# Patient Record
Sex: Female | Born: 1960 | Race: Black or African American | Hispanic: No | Smoking: Never smoker
Health system: Southern US, Community
[De-identification: ages and names within clinical notes are randomized; demographics above are authoritative.]

## PROBLEM LIST (undated history)

## (undated) DIAGNOSIS — C50919 Malignant neoplasm of unspecified site of unspecified female breast: Secondary | ICD-10-CM

## (undated) DIAGNOSIS — E119 Type 2 diabetes mellitus without complications: Secondary | ICD-10-CM

## (undated) DIAGNOSIS — I10 Essential (primary) hypertension: Secondary | ICD-10-CM

## (undated) DIAGNOSIS — C801 Malignant (primary) neoplasm, unspecified: Secondary | ICD-10-CM

## (undated) HISTORY — PX: ABDOMINAL HYSTERECTOMY: SHX81

## (undated) HISTORY — PX: BREAST LUMPECTOMY: SHX2

## (undated) HISTORY — PX: BACK SURGERY: SHX140

## (undated) HISTORY — PX: BREAST SURGERY: SHX581

## (undated) HISTORY — PX: CHOLECYSTECTOMY: SHX55

---

## 2016-09-19 ENCOUNTER — Encounter: Payer: Self-pay | Admitting: Emergency Medicine

## 2016-09-19 ENCOUNTER — Observation Stay
Admission: EM | Admit: 2016-09-19 | Discharge: 2016-09-20 | Disposition: A | Discharge status: Home / Self Care | Attending: Internal Medicine | Admitting: Internal Medicine

## 2016-09-19 ENCOUNTER — Emergency Department

## 2016-09-19 DIAGNOSIS — Z7984 Long term (current) use of oral hypoglycemic drugs: Secondary | ICD-10-CM | POA: Diagnosis not present

## 2016-09-19 DIAGNOSIS — E119 Type 2 diabetes mellitus without complications: Secondary | ICD-10-CM | POA: Diagnosis not present

## 2016-09-19 DIAGNOSIS — R112 Nausea with vomiting, unspecified: Secondary | ICD-10-CM | POA: Diagnosis present

## 2016-09-19 DIAGNOSIS — M797 Fibromyalgia: Secondary | ICD-10-CM | POA: Insufficient documentation

## 2016-09-19 DIAGNOSIS — R42 Dizziness and giddiness: Secondary | ICD-10-CM | POA: Diagnosis not present

## 2016-09-19 DIAGNOSIS — Z853 Personal history of malignant neoplasm of breast: Secondary | ICD-10-CM | POA: Diagnosis not present

## 2016-09-19 DIAGNOSIS — K529 Noninfective gastroenteritis and colitis, unspecified: Secondary | ICD-10-CM | POA: Diagnosis not present

## 2016-09-19 DIAGNOSIS — E876 Hypokalemia: Secondary | ICD-10-CM | POA: Diagnosis not present

## 2016-09-19 DIAGNOSIS — I10 Essential (primary) hypertension: Secondary | ICD-10-CM | POA: Insufficient documentation

## 2016-09-19 DIAGNOSIS — R197 Diarrhea, unspecified: Secondary | ICD-10-CM

## 2016-09-19 HISTORY — DX: Malignant (primary) neoplasm, unspecified: C80.1

## 2016-09-19 HISTORY — DX: Essential (primary) hypertension: I10

## 2016-09-19 HISTORY — DX: Type 2 diabetes mellitus without complications: E11.9

## 2016-09-19 LAB — URINALYSIS, COMPLETE (UACMP) WITH MICROSCOPIC
BILIRUBIN URINE: NEGATIVE
Glucose, UA: NEGATIVE mg/dL
HGB URINE DIPSTICK: NEGATIVE
KETONES UR: NEGATIVE mg/dL
LEUKOCYTES UA: NEGATIVE
NITRITE: POSITIVE — AB
PH: 6 (ref 5.0–8.0)
Protein, ur: NEGATIVE mg/dL
SPECIFIC GRAVITY, URINE: 1.012 (ref 1.005–1.030)

## 2016-09-19 LAB — COMPREHENSIVE METABOLIC PANEL
ALT: 27 U/L (ref 14–54)
AST: 35 U/L (ref 15–41)
Albumin: 4.3 g/dL (ref 3.5–5.0)
Alkaline Phosphatase: 68 U/L (ref 38–126)
Anion gap: 9 (ref 5–15)
BILIRUBIN TOTAL: 0.8 mg/dL (ref 0.3–1.2)
BUN: 15 mg/dL (ref 6–20)
CHLORIDE: 100 mmol/L — AB (ref 101–111)
CO2: 29 mmol/L (ref 22–32)
Calcium: 9.5 mg/dL (ref 8.9–10.3)
Creatinine, Ser: 0.64 mg/dL (ref 0.44–1.00)
Glucose, Bld: 171 mg/dL — ABNORMAL HIGH (ref 65–99)
POTASSIUM: 3 mmol/L — AB (ref 3.5–5.1)
Sodium: 138 mmol/L (ref 135–145)
TOTAL PROTEIN: 9.3 g/dL — AB (ref 6.5–8.1)

## 2016-09-19 LAB — CBC
HEMATOCRIT: 37.1 % (ref 35.0–47.0)
Hemoglobin: 12.2 g/dL (ref 12.0–16.0)
MCH: 25.5 pg — ABNORMAL LOW (ref 26.0–34.0)
MCHC: 32.9 g/dL (ref 32.0–36.0)
MCV: 77.3 fL — AB (ref 80.0–100.0)
Platelets: 238 10*3/uL (ref 150–440)
RBC: 4.8 MIL/uL (ref 3.80–5.20)
RDW: 17.7 % — AB (ref 11.5–14.5)
WBC: 8.7 10*3/uL (ref 3.6–11.0)

## 2016-09-19 LAB — GASTROINTESTINAL PANEL BY PCR, STOOL (REPLACES STOOL CULTURE)
Adenovirus F40/41: NOT DETECTED
Astrovirus: NOT DETECTED
CRYPTOSPORIDIUM: NOT DETECTED
CYCLOSPORA CAYETANENSIS: NOT DETECTED
Campylobacter species: NOT DETECTED
ENTEROAGGREGATIVE E COLI (EAEC): NOT DETECTED
Entamoeba histolytica: NOT DETECTED
Enteropathogenic E coli (EPEC): NOT DETECTED
Enterotoxigenic E coli (ETEC): NOT DETECTED
GIARDIA LAMBLIA: NOT DETECTED
Norovirus GI/GII: NOT DETECTED
Plesimonas shigelloides: NOT DETECTED
Rotavirus A: NOT DETECTED
SALMONELLA SPECIES: NOT DETECTED
SAPOVIRUS (I, II, IV, AND V): NOT DETECTED
SHIGA LIKE TOXIN PRODUCING E COLI (STEC): NOT DETECTED
SHIGELLA/ENTEROINVASIVE E COLI (EIEC): NOT DETECTED
VIBRIO CHOLERAE: NOT DETECTED
VIBRIO SPECIES: NOT DETECTED
YERSINIA ENTEROCOLITICA: NOT DETECTED

## 2016-09-19 LAB — LIPASE, BLOOD: LIPASE: 16 U/L (ref 11–51)

## 2016-09-19 LAB — TROPONIN I: Troponin I: <0.03 ng/mL (ref <0.03)

## 2016-09-19 LAB — INFLUENZA PANEL BY PCR (TYPE A & B)
Influenza A By PCR: NEGATIVE
Influenza B By PCR: NEGATIVE

## 2016-09-19 MED ORDER — DEXTROSE 5 % IV SOLN: 1.0000 g | Freq: Once | INTRAVENOUS | Status: DC

## 2016-09-19 MED ORDER — SODIUM CHLORIDE 0.9 % IV BOLUS (SEPSIS)
500.0000 mL | Freq: Once | INTRAVENOUS | Status: AC
  Administered 2016-09-19: 500 mL via INTRAVENOUS

## 2016-09-19 MED ORDER — ONDANSETRON 4 MG PO TBDP
ORAL_TABLET | ORAL | Status: AC
  Filled 2016-09-19: qty 1

## 2016-09-19 MED ORDER — ONDANSETRON 4 MG PO TBDP
ORAL_TABLET | ORAL | Status: AC
  Administered 2016-09-19: 4 mg via ORAL
  Filled 2016-09-19: qty 1

## 2016-09-19 MED ORDER — POTASSIUM CHLORIDE CRYS ER 20 MEQ PO TBCR
40.0000 meq | EXTENDED_RELEASE_TABLET | Freq: Once | ORAL | Status: AC
  Administered 2016-09-19: 40 meq via ORAL

## 2016-09-19 MED ORDER — ONDANSETRON 4 MG PO TBDP
4.0000 mg | ORAL_TABLET | Freq: Once | ORAL | Status: AC | PRN
  Administered 2016-09-19: 4 mg via ORAL

## 2016-09-19 MED ORDER — IOPAMIDOL (ISOVUE-300) INJECTION 61%
100.0000 mL | Freq: Once | INTRAVENOUS | Status: AC | PRN
  Administered 2016-09-19: 100 mL via INTRAVENOUS
  Filled 2016-09-19: qty 100

## 2016-09-19 MED ORDER — POTASSIUM CHLORIDE CRYS ER 20 MEQ PO TBCR
EXTENDED_RELEASE_TABLET | ORAL | Status: AC
  Administered 2016-09-19: 40 meq via ORAL
  Filled 2016-09-19: qty 2

## 2016-09-19 MED ORDER — PROMETHAZINE HCL 25 MG/ML IJ SOLN
INTRAMUSCULAR | Status: AC
  Administered 2016-09-19: 12.5 mg via INTRAVENOUS
  Filled 2016-09-19: qty 1

## 2016-09-19 MED ORDER — PROMETHAZINE HCL 25 MG/ML IJ SOLN
12.5000 mg | Freq: Once | INTRAMUSCULAR | Status: AC
  Administered 2016-09-19: 12.5 mg via INTRAVENOUS

## 2016-09-19 MED ORDER — SODIUM CHLORIDE 0.9 % IV SOLN
30.0000 meq | Freq: Once | INTRAVENOUS | Status: AC
  Administered 2016-09-19: 30 meq via INTRAVENOUS
  Filled 2016-09-19: qty 15

## 2016-09-19 MED ORDER — MECLIZINE HCL 25 MG PO TABS
25.0000 mg | ORAL_TABLET | Freq: Once | ORAL | Status: AC
  Administered 2016-09-19: 25 mg via ORAL
  Filled 2016-09-19: qty 1

## 2016-09-19 MED ORDER — SODIUM CHLORIDE 0.9 % IV BOLUS (SEPSIS)
1000.0000 mL | Freq: Once | INTRAVENOUS | Status: AC
  Administered 2016-09-19: 1000 mL via INTRAVENOUS

## 2016-09-19 MED ORDER — METOCLOPRAMIDE HCL 5 MG/ML IJ SOLN
10.0000 mg | Freq: Once | INTRAMUSCULAR | Status: AC
  Administered 2016-09-19: 10 mg via INTRAVENOUS
  Filled 2016-09-19: qty 2

## 2016-09-19 NOTE — ED Notes (Signed)
Called pharmacy to request medication 

## 2016-09-19 NOTE — ED Provider Notes (Addendum)
[facility] Emergency Department Provider Note    First MD Initiated Contact with Patient 09/19/16 1651     (approximate)  I have reviewed the triage vital signs and the nursing notes.   HISTORY  Chief Complaint Emesis and Diarrhea    HPI Marie Cline is a 56 y.o. female with a history of diabetes, hypertension, breast cancer as well as fibromyalgia presents with sudden onset of nausea vomiting and diarrhea that occurred this afternoon. No significant abdominal pain. States that she vomited 1 times upon arrival. States that since the vomiting started she had been feeling very dizzy anytime she opens her eyes or tries to move. Denies any associated numbness or tingling. Denies any dysuria or flank pain.  States that every time she stands up she feels like she is about to faint due to the nausea and dizziness. Was given Zofran in triage with some improvement in the nausea.   Past Medical History:  Diagnosis Date  . Cancer [facility])    Breast Cancer  . Diabetes mellitus without complication ([place])   . Hypertension    No family history on file. Past Surgical History:  Procedure Laterality Date  . ABDOMINAL HYSTERECTOMY    . BACK SURGERY    . BREAST SURGERY Left   . CHOLECYSTECTOMY     There are no active problems to display for this patient.     Prior to Admission medications   Not on File    Allergies Iodine solution [povidone iodine]    Social History Social History  Substance Use Topics  . Smoking status: Never Smoker  . Smokeless tobacco: Never Used  . Alcohol use No    Review of Systems Patient denies headaches, rhinorrhea, blurry vision, numbness, shortness of breath, chest pain, edema, cough, abdominal pain, nausea, vomiting, diarrhea, dysuria, fevers, rashes or hallucinations unless otherwise stated above in HPI. ____________________________________________   PHYSICAL EXAM:  VITAL SIGNS: Vitals:   09/19/16 1514 09/19/16  2025  BP: (!) 167/101 (!) 164/93  Pulse: 85 67  Resp: 18 19  Temp: 98 F (36.7 C)     Constitutional: Alert and oriented. Ill appearing but in no acute distress. Eyes: Conjunctivae are normal. PERRL. EOMI. Head: Atraumatic. Nose: No congestion/rhinnorhea. Mouth/Throat: Mucous membranes are moist.  Oropharynx non-erythematous. Neck: No stridor. Painless ROM. No cervical spine tenderness to palpation Hematological/Lymphatic/Immunilogical: No cervical lymphadenopathy. Cardiovascular: Normal rate, regular rhythm. Grossly normal heart sounds.  Good peripheral circulation. Respiratory: Normal respiratory effort.  No retractions. Lungs CTAB. Gastrointestinal: Soft and with mild llq ttp. No distention. No abdominal bruits. No CVA tenderness. Musculoskeletal: No lower extremity tenderness nor edema.  No joint effusions. Neurologic:  Normal speech and language. Lateral nystagmus with leftward gaze that is fatiguable.  No facial droop.  SILT throughout. MAE.  Able to ambulate Skin:  Skin is warm, dry and intact. No rash noted. Psychiatric: Mood and affect are normal. Speech and behavior are normal.  ____________________________________________   LABS (all labs ordered are listed, but only abnormal results are displayed)  Results for orders placed or performed during the hospital encounter of 09/19/16 (from the past 24 hour(s))  Lipase, blood     Status: None   Collection Time: 09/19/16  3:16 PM  Result Value Ref Range   Lipase 16 11 - 51 U/L  Comprehensive metabolic panel     Status: Abnormal   Collection Time: 09/19/16  3:16 PM  Result Value Ref Range   Sodium 138 135 - 145 mmol/L  Potassium 3.0 (L) 3.5 - 5.1 mmol/L   Chloride 100 (L) 101 - 111 mmol/L   CO2 29 22 - 32 mmol/L   Glucose, Bld 171 (H) 65 - 99 mg/dL   BUN 15 6 - 20 mg/dL   Creatinine, Ser 0.64 0.44 - 1.00 mg/dL   Calcium 9.5 8.9 - 10.3 mg/dL   Total Protein 9.3 (H) 6.5 - 8.1 g/dL   Albumin 4.3 3.5 - 5.0 g/dL   AST 35  15 - 41 U/L   ALT 27 14 - 54 U/L   Alkaline Phosphatase 68 38 - 126 U/L   Total Bilirubin 0.8 0.3 - 1.2 mg/dL   GFR calc non Af Amer >60 >60 mL/min   GFR calc Af Amer >60 >60 mL/min   Anion gap 9 5 - 15  CBC     Status: Abnormal   Collection Time: 09/19/16  3:16 PM  Result Value Ref Range   WBC 8.7 3.6 - 11.0 K/uL   RBC 4.80 3.80 - 5.20 MIL/uL   Hemoglobin 12.2 12.0 - 16.0 g/dL   HCT 37.1 35.0 - 47.0 %   MCV 77.3 (L) 80.0 - 100.0 fL   MCH 25.5 (L) 26.0 - 34.0 pg   MCHC 32.9 32.0 - 36.0 g/dL   RDW 17.7 (H) 11.5 - 14.5 %   Platelets 238 150 - 440 K/uL  Urinalysis, Complete w Microscopic     Status: Abnormal   Collection Time: 09/19/16  3:16 PM  Result Value Ref Range   Color, Urine YELLOW (A) YELLOW   APPearance HAZY (A) CLEAR   Specific Gravity, Urine 1.012 1.005 - 1.030   pH 6.0 5.0 - 8.0   Glucose, UA NEGATIVE NEGATIVE mg/dL   Hgb urine dipstick NEGATIVE NEGATIVE   Bilirubin Urine NEGATIVE NEGATIVE   Ketones, ur NEGATIVE NEGATIVE mg/dL   Protein, ur NEGATIVE NEGATIVE mg/dL   Nitrite POSITIVE (A) NEGATIVE   Leukocytes, UA NEGATIVE NEGATIVE   RBC / HPF 0-5 0 - 5 RBC/hpf   WBC, UA 6-30 0 - 5 WBC/hpf   Bacteria, UA MANY (A) NONE SEEN   Squamous Epithelial / LPF 0-5 (A) NONE SEEN   Mucous PRESENT   Troponin I     Status: None   Collection Time: 09/19/16  3:16 PM  Result Value Ref Range   Troponin I <0.03 <0.03 ng/mL  Influenza panel by PCR (type A & B)     Status: None   Collection Time: 09/19/16  7:02 PM  Result Value Ref Range   Influenza A By PCR NEGATIVE NEGATIVE   Influenza B By PCR NEGATIVE NEGATIVE  Gastrointestinal Panel by PCR , Stool     Status: None   Collection Time: 09/19/16  8:49 PM  Result Value Ref Range   Campylobacter species NOT DETECTED NOT DETECTED   Plesimonas shigelloides NOT DETECTED NOT DETECTED   Salmonella species NOT DETECTED NOT DETECTED   Yersinia enterocolitica NOT DETECTED NOT DETECTED   Vibrio species NOT DETECTED NOT DETECTED    Vibrio cholerae NOT DETECTED NOT DETECTED   Enteroaggregative E coli (EAEC) NOT DETECTED NOT DETECTED   Enteropathogenic E coli (EPEC) NOT DETECTED NOT DETECTED   Enterotoxigenic E coli (ETEC) NOT DETECTED NOT DETECTED   Shiga like toxin producing E coli (STEC) NOT DETECTED NOT DETECTED   Shigella/Enteroinvasive E coli (EIEC) NOT DETECTED NOT DETECTED   Cryptosporidium NOT DETECTED NOT DETECTED   Cyclospora cayetanensis NOT DETECTED NOT DETECTED   Entamoeba histolytica NOT DETECTED NOT DETECTED  Giardia lamblia NOT DETECTED NOT DETECTED   Adenovirus F40/41 NOT DETECTED NOT DETECTED   Astrovirus NOT DETECTED NOT DETECTED   Norovirus GI/GII NOT DETECTED NOT DETECTED   Rotavirus A NOT DETECTED NOT DETECTED   Sapovirus (I, II, IV, and V) NOT DETECTED NOT DETECTED   ____________________________________________  EKG My review and personal interpretation at Time: 19:04   Indication: dizziness  Rate: 70  Rhythm: sinus Axis: normal Other: no st elevations or depressions, normal intervals ____________________________________________  RADIOLOGY  I personally reviewed all radiographic images ordered to evaluate for the above acute complaints and reviewed radiology reports and findings.  These findings were personally discussed with the patient.  Please see medical record for radiology report.  ____________________________________________   PROCEDURES  Procedure(s) performed:  Procedures    Critical Care performed: no ____________________________________________   INITIAL IMPRESSION / ASSESSMENT AND PLAN / ED COURSE  Pertinent labs & imaging results that were available during my care of the patient were reviewed by me and considered in my medical decision making (see chart for details).  DDX: enteritis, flu, colitis, diverticultisis, sbo, cva, dehydration, dysrhythmia  Marie Cline is a 56 y.o. who presents to the ED with chief complaint of dizziness and lightheaded as well as  nausea vomiting and diarrhea. Patient's afebrile and otherwise hemodynamic stable but very ill-appearing.  Very confusing presentation and difficult to get patient to pin down whether she was primarily dizzy versus dizziness and symptoms resulted after the nausea and vomiting. She is no focal neurodeficits. May have component of vertigo given the abruptness of nausea vomiting and dizziness. However, would not expect this to be associated with diarrhea. Does have some abdominal tenderness to palpation and based on her age will order CT imaging to evaluate for any evidence of obstruction, colitis or diverticulitis.  The patient will be placed on continuous pulse oximetry and telemetry for monitoring.  Laboratory evaluation will be sent to evaluate for the above complaints.     Clinical Course as of Sep 20 2235  Thu Sep 19, 2016  2030 Patient reassessed. States symptoms are improving. Discussed results of CT imaging with patient. Urinalysis was some bacteria and nitrite positive the patient denies any sort of dysuria or urgency. Likely contaminated specimen.  [PR]  2143 Patient unable tolerate by mouth medication.  Had another episode of large volume emesis and diarrhea. We'll give IV potassium abd continue IVF  [PR]  2231 Patient rechecked. Remains human dynamically stable. Again denies any dysuria or flank pain. Stool panel is unremarkable. At this point I do suspect some component of enteritis. Presentation is unclear. She is unable to tolerate by mouth at this point she is receiving IV potassium for supplementation as well as IV fluids. Discussed case with the hospitalist, dr. Ara Kussmaul, for evaluation for observation for continued IV fluids and symptomatic management.  Patient feels symptomatically better and in no acute distress at he feel he should be appropriate for discharge home however she does not want to be discharged at this point.  Have discussed with the patient and available family all  diagnostics and treatments performed thus far and all questions were answered to the best of my ability. The patient demonstrates understanding and agreement with plan.   [PR]    Clinical Course User Index [PR] Merlyn Lot, MD     ____________________________________________   FINAL CLINICAL IMPRESSION(S) / ED DIAGNOSES  Final diagnoses:  Nausea vomiting and diarrhea  Dizziness  Hypokalemia      NEW MEDICATIONS STARTED  DURING THIS VISIT:  New Prescriptions   No medications on file     Note:  This document was prepared using Dragon voice recognition software and may include unintentional dictation errors.    Merlyn Lot, MD 09/19/16 8335    Merlyn Lot, MD 09/19/16 2237

## 2016-09-19 NOTE — H&P (Signed)
History and Physical   [facility] [place] [facility] Admission History and Physical [employer], D.O.    Patient Name: Marie Cline MR#: [id number] Date of Birth: [birth year] Date of Admission: 09/19/2016  Referring MD/NP/PA: Dr. Quentin Cornwall Primary Care Physician: Pcp Not In System Patient coming from: Home Outpatient Specialists: None   Chief Complaint:  Chief Complaint  Patient presents with  . Emesis  . Diarrhea    HPI: Marie Cline is a 56 y.o. female with a known history of Fibromyalgia, Breast cancer, diabetes, hypertension presents to the emergency department for evaluation of nausea, vomiting, diarrhea.  Patient was in a usual state of health until this afternoon when she developed a sudden onset of nausea vomiting and diarrhea without any significant abdominal pain. Her vomiting has been intractable and has been associated with dizziness and lightheadedness area she does have some improvement with Zofran..  Patient denies fevers/chills, weakness, dizziness, chest pain, shortness of breath, abdominal pain, back or flank pain, dysuria/frequency, changes in mental status.    Otherwise there has been no change in status. Patient has been taking medication as prescribed and there has been no recent change in medication or diet.  No recent antibiotics.  There has been no recent illness, hospitalizations, travel or sick contacts.    EMS/ED Course: Patient received meclizine, Reglan, Zofran, potassium, Phenergan, normal saline.  Review of Systems:  CONSTITUTIONAL: No fever/chills, fatigue, weakness, weight gain/loss, headache. EYES: No blurry or double vision. ENT: No tinnitus, postnasal drip, redness or soreness of the oropharynx. RESPIRATORY: No cough, dyspnea, wheeze.  No hemoptysis.  CARDIOVASCULAR: No chest pain, palpitations, syncope, orthopnea. No lower extremity edema.  GASTROINTESTINAL: Positive nausea, vomiting, diarrhea, negative abdominal pain,  constipation.  No hematemesis, melena or hematochezia. GENITOURINARY: No dysuria, frequency, hematuria. ENDOCRINE: No polyuria or nocturia. No heat or cold intolerance. HEMATOLOGY: No anemia, bruising, bleeding. INTEGUMENTARY: No rashes, ulcers, lesions. MUSCULOSKELETAL: No arthritis, gout, dyspnea. NEUROLOGIC: No numbness, tingling, ataxia, seizure-type activity, weakness. PSYCHIATRIC: No anxiety, depression, insomnia.   Past Medical History:  Diagnosis Date  . Cancer [facility])    Breast Cancer  . Diabetes mellitus without complication ([place])   . Hypertension     Past Surgical History:  Procedure Laterality Date  . ABDOMINAL HYSTERECTOMY    . BACK SURGERY    . BREAST SURGERY Left   . CHOLECYSTECTOMY       reports that she has never smoked. She has never used smokeless tobacco. She reports that she does not drink alcohol or use drugs.  Allergies  Allergen Reactions  . Iodine Solution [Povidone Iodine] Itching and Swelling    No family history on file.  Prior to Admission medications   Not on File    Physical Exam: Vitals:   09/19/16 1514 09/19/16 2025 09/19/16 2235  BP: (!) 167/101 (!) 164/93 (!) 150/91  Pulse: 85 67 71  Resp: 18 19 15   Temp: 98 F (36.7 C)    SpO2: 98% 98% 100%  Weight: 102.1 kg (225 lb)    Height: 5\' 7"  (1.702 m)      GENERAL: 56 y.o.-year-oFemale, well-developed, well-nourished lying in the bed in no acute distress.  Pleasant and cooperative.  Ill-appearing HEENT: Head atraumatic, normocephalic. Pupils equal, round, reactive to light and accommodation. No scleral icterus. Extraocular muscles intact. Nares are patent. Oropharynx is clear. Mucus membranes dry. NECK: Supple, full range of motion. No JVD, no bruit heard. No thyroid enlargement, no tenderness, no cervical lymphadenopathy. CHEST: Normal breath sounds bilaterally. No  wheezing, rales, rhonchi or crackles. No use of accessory muscles of respiration.  No reproducible chest wall tenderness.   CARDIOVASCULAR: S1, S2 normal. No murmurs, rubs, or gallops. Cap refill <2 seconds. Pulses intact distally.  ABDOMEN: Soft, nondistended, nontender. No rebound, guarding, rigidity. Normoactive bowel sounds present in all four quadrants. No organomegaly or mass. EXTREMITIES: No pedal edema, cyanosis, or clubbing. No calf tenderness or Homan's sign.  NEUROLOGIC: The patient is alert and oriented x 3. Cranial nerves II through XII are grossly intact with no focal sensorimotor deficit. Muscle strength 5/5 in all extremities. Sensation intact. Gait not checked. PSYCHIATRIC:  Normal affect, mood, thought content. SKIN: Warm, dry, and intact without obvious rash, lesion, or ulcer.    Labs on Admission:  CBC:  Recent Labs Lab 09/19/16 1516  WBC 8.7  HGB 12.2  HCT 37.1  MCV 77.3*  PLT 623   Basic Metabolic Panel:  Recent Labs Lab 09/19/16 1516  NA 138  K 3.0*  CL 100*  CO2 29  GLUCOSE 171*  BUN 15  CREATININE 0.64  CALCIUM 9.5   GFR: Estimated Creatinine Clearance: 97.6 mL/min (by C-G formula based on SCr of 0.64 mg/dL). Liver Function Tests:  Recent Labs Lab 09/19/16 1516  AST 35  ALT 27  ALKPHOS 68  BILITOT 0.8  PROT 9.3*  ALBUMIN 4.3    Recent Labs Lab 09/19/16 1516  LIPASE 16   No results for input(s): AMMONIA in the last 168 hours. Coagulation Profile: No results for input(s): INR, PROTIME in the last 168 hours. Cardiac Enzymes:  Recent Labs Lab 09/19/16 1516  TROPONINI <0.03   BNP (last 3 results) No results for input(s): PROBNP in the last 8760 hours. HbA1C: No results for input(s): HGBA1C in the last 72 hours. CBG: No results for input(s): GLUCAP in the last 168 hours. Lipid Profile: No results for input(s): CHOL, HDL, LDLCALC, TRIG, CHOLHDL, LDLDIRECT in the last 72 hours. Thyroid Function Tests: No results for input(s): TSH, T4TOTAL, FREET4, T3FREE, THYROIDAB in the last 72 hours. Anemia Panel: No results for input(s): VITAMINB12, FOLATE,  FERRITIN, TIBC, IRON, RETICCTPCT in the last 72 hours. Urine analysis:    Component Value Date/Time   COLORURINE YELLOW (A) 09/19/2016 1516   APPEARANCEUR HAZY (A) 09/19/2016 1516   LABSPEC 1.012 09/19/2016 1516   PHURINE 6.0 09/19/2016 1516   GLUCOSEU NEGATIVE 09/19/2016 1516   HGBUR NEGATIVE 09/19/2016 1516   BILIRUBINUR NEGATIVE 09/19/2016 1516   KETONESUR NEGATIVE 09/19/2016 1516   PROTEINUR NEGATIVE 09/19/2016 1516   NITRITE POSITIVE (A) 09/19/2016 1516   LEUKOCYTESUR NEGATIVE 09/19/2016 1516   Sepsis Labs: @LABRCNTIP (procalcitonin:4,lacticidven:4) ) Recent Results (from the past 240 hour(s))  Gastrointestinal Panel by PCR , Stool     Status: None   Collection Time: 09/19/16  8:49 PM  Result Value Ref Range Status   Campylobacter species NOT DETECTED NOT DETECTED Final   Plesimonas shigelloides NOT DETECTED NOT DETECTED Final   Salmonella species NOT DETECTED NOT DETECTED Final   Yersinia enterocolitica NOT DETECTED NOT DETECTED Final   Vibrio species NOT DETECTED NOT DETECTED Final   Vibrio cholerae NOT DETECTED NOT DETECTED Final   Enteroaggregative E coli (EAEC) NOT DETECTED NOT DETECTED Final   Enteropathogenic E coli (EPEC) NOT DETECTED NOT DETECTED Final   Enterotoxigenic E coli (ETEC) NOT DETECTED NOT DETECTED Final   Shiga like toxin producing E coli (STEC) NOT DETECTED NOT DETECTED Final   Shigella/Enteroinvasive E coli (EIEC) NOT DETECTED NOT DETECTED Final   Cryptosporidium NOT  DETECTED NOT DETECTED Final   Cyclospora cayetanensis NOT DETECTED NOT DETECTED Final   Entamoeba histolytica NOT DETECTED NOT DETECTED Final   Giardia lamblia NOT DETECTED NOT DETECTED Final   Adenovirus F40/41 NOT DETECTED NOT DETECTED Final   Astrovirus NOT DETECTED NOT DETECTED Final   Norovirus GI/GII NOT DETECTED NOT DETECTED Final   Rotavirus A NOT DETECTED NOT DETECTED Final   Sapovirus (I, II, IV, and V) NOT DETECTED NOT DETECTED Final     Radiological Exams on  Admission: Ct Head Wo Contrast  Result Date: 09/19/2016 CLINICAL DATA:  Sudden onset of nausea and vomiting.  Dizziness. EXAM: CT HEAD WITHOUT CONTRAST TECHNIQUE: Contiguous axial images were obtained from the base of the skull through the vertex without intravenous contrast. COMPARISON:  None. FINDINGS: Brain: No evidence of acute infarction, hemorrhage, hydrocephalus, extra-axial collection or mass lesion/mass effect. Normal cerebral volume. No white matter disease. Vascular: Mild vascular calcification in the carotid siphons. Skull: Calvarium is intact. There is mild dural thickening over the convexity as well as alongside the anterior clinoids. No calcified or ossified mass to suggest meningioma. Sinuses/Orbits: Negative. Other: None. IMPRESSION: Negative exam. No acute or focal intracranial abnormality. No evidence for bleed or CVA. Electronically Signed   By: Staci Righter M.D.   On: 09/19/2016 18:56   Ct Abdomen Pelvis W Contrast  Result Date: 09/19/2016 CLINICAL DATA:  Acute onset of nausea, vomiting and diarrhea. Dizziness. Initial encounter. EXAM: CT ABDOMEN AND PELVIS WITH CONTRAST TECHNIQUE: Multidetector CT imaging of the abdomen and pelvis was performed using the standard protocol following bolus administration of intravenous contrast. CONTRAST:  127mL ISOVUE-300 IOPAMIDOL (ISOVUE-300) INJECTION 61% COMPARISON:  None. FINDINGS: Lower chest: Minimal bibasilar atelectasis is noted. The visualized portions of the mediastinum are unremarkable. Hepatobiliary: The liver is unremarkable in appearance. The patient is status post cholecystectomy, with clips noted at the gallbladder fossa. The common bile duct remains normal in caliber. Pancreas: The pancreas is within normal limits. Spleen: The spleen is unremarkable in appearance. Adrenals/Urinary Tract: The adrenal glands are unremarkable in appearance. A small left renal cyst is noted. There is no evidence of hydronephrosis. No renal or ureteral  stones are identified. No perinephric stranding is seen. Stomach/Bowel: The stomach is unremarkable in appearance. The small bowel is within normal limits. The appendix is normal in caliber, without evidence of appendicitis. The colon is unremarkable in appearance. Vascular/Lymphatic: The abdominal aorta is unremarkable in appearance. The inferior vena cava is grossly unremarkable. No retroperitoneal lymphadenopathy is seen. No pelvic sidewall lymphadenopathy is identified. Reproductive: The bladder is mildly distended and grossly unremarkable. The patient is status post hysterectomy. No suspicious adnexal masses are seen. Other: No additional soft tissue abnormalities are seen. Musculoskeletal: No acute osseous abnormalities are identified. The visualized musculature is unremarkable in appearance. IMPRESSION: 1. No acute abnormality seen to explain the patient's symptoms. 2. Small left renal cyst noted. Electronically Signed   By: Garald Balding M.D.   On: 09/19/2016 18:58    EKG: Normal sinus rhythm 70 bpm with normal axis and nonspecific ST-T wave changes.   Assessment/Plan  This is a 56 y.o. female with a history of fibromyalgia, diabetes, hypertension, breast cancerw being admitted with:  #. Intractable nausea and vomiting likely secondary to gastroenteritis -Admit to observation -IV fluid hydration -Antiemetics -Repeat urinalysis -Continue meclizine  #. Hypokalemia, mild and secondary to #1 -Replace IV  Admission status: Observation  IV F: normal saline Diet/Nutrition: Nothing by mouth, advance to heart healthy as tolerated Consults called:  None DVT Px: SCDs and early ambulation. Add Lovenox if converted to inpatient Code Status: Full Code  Disposition Plan: To home in less than 24 hours   records are reviewed and case discussed with ED provider. Management plans discussed with the patient and/or family who express understanding and agree with plan of care.  Laynee Lockamy D.O.  on 09/19/2016 at 11:26 PM Between 7am to 6pm - Pager - [phone] After 6pm go to [email or web address] - password [email or web address] [facility] [place] [facility] Office [phone] CC: Primary care physician; Pcp Not In System   09/19/2016, 11:26 PM

## 2016-09-19 NOTE — ED Triage Notes (Addendum)
Pt in via EMS from home with complaints of sudden onset N/V/D this afternoon.  Pt denies any abdominal pain.  Pt vomited x 1 upon arrival.  Pt states, "when I open my eyes I get dizzy, every time I move I get nauseas."  ODT zofran given.

## 2016-09-20 ENCOUNTER — Emergency Department
Admission: EM | Admit: 2016-09-20 | Discharge: 2016-09-20 | Disposition: A | Discharge status: Home / Self Care | Source: Home / Self Care | Attending: Emergency Medicine | Admitting: Emergency Medicine

## 2016-09-20 DIAGNOSIS — I10 Essential (primary) hypertension: Secondary | ICD-10-CM | POA: Insufficient documentation

## 2016-09-20 DIAGNOSIS — R42 Dizziness and giddiness: Secondary | ICD-10-CM | POA: Insufficient documentation

## 2016-09-20 DIAGNOSIS — Z79899 Other long term (current) drug therapy: Secondary | ICD-10-CM | POA: Insufficient documentation

## 2016-09-20 DIAGNOSIS — Z853 Personal history of malignant neoplasm of breast: Secondary | ICD-10-CM | POA: Insufficient documentation

## 2016-09-20 DIAGNOSIS — Z7984 Long term (current) use of oral hypoglycemic drugs: Secondary | ICD-10-CM

## 2016-09-20 DIAGNOSIS — E119 Type 2 diabetes mellitus without complications: Secondary | ICD-10-CM | POA: Insufficient documentation

## 2016-09-20 LAB — CBC
HCT: 35.3 % (ref 35.0–47.0)
HCT: 35.1 % (ref 35.0–47.0)
HEMOGLOBIN: 11.5 g/dL — AB (ref 12.0–16.0)
Hemoglobin: 11.7 g/dL — ABNORMAL LOW (ref 12.0–16.0)
MCH: 25.2 pg — ABNORMAL LOW (ref 26.0–34.0)
MCH: 25.3 pg — ABNORMAL LOW (ref 26.0–34.0)
MCHC: 33.2 g/dL (ref 32.0–36.0)
MCHC: 32.9 g/dL (ref 32.0–36.0)
MCV: 75.9 fL — ABNORMAL LOW (ref 80.0–100.0)
MCV: 76.8 fL — ABNORMAL LOW (ref 80.0–100.0)
PLATELETS: 227 10*3/uL (ref 150–440)
PLATELETS: 208 10*3/uL (ref 150–440)
RBC: 4.65 MIL/uL (ref 3.80–5.20)
RBC: 4.57 MIL/uL (ref 3.80–5.20)
RDW: 17.5 % — ABNORMAL HIGH (ref 11.5–14.5)
RDW: 17.3 % — ABNORMAL HIGH (ref 11.5–14.5)
WBC: 10.9 10*3/uL (ref 3.6–11.0)
WBC: 7.1 10*3/uL (ref 3.6–11.0)

## 2016-09-20 LAB — URINALYSIS, COMPLETE (UACMP) WITH MICROSCOPIC
Bacteria, UA: NONE SEEN
Bilirubin Urine: NEGATIVE
Glucose, UA: NEGATIVE mg/dL
HGB URINE DIPSTICK: NEGATIVE
KETONES UR: NEGATIVE mg/dL
Leukocytes, UA: NEGATIVE
NITRITE: NEGATIVE
PROTEIN: NEGATIVE mg/dL
Specific Gravity, Urine: 1.009 (ref 1.005–1.030)
pH: 6 (ref 5.0–8.0)

## 2016-09-20 LAB — COMPREHENSIVE METABOLIC PANEL
ALBUMIN: 3.8 g/dL (ref 3.5–5.0)
ALT: 24 U/L (ref 14–54)
ANION GAP: 5 (ref 5–15)
AST: 35 U/L (ref 15–41)
Alkaline Phosphatase: 62 U/L (ref 38–126)
BUN: 11 mg/dL (ref 6–20)
CHLORIDE: 109 mmol/L (ref 101–111)
CO2: 28 mmol/L (ref 22–32)
Calcium: 8.9 mg/dL (ref 8.9–10.3)
Creatinine, Ser: 0.68 mg/dL (ref 0.44–1.00)
GFR calc Af Amer: >60 mL/min (ref >60)
GFR calc non Af Amer: >60 mL/min (ref >60)
GLUCOSE: 102 mg/dL — AB (ref 65–99)
POTASSIUM: 3.7 mmol/L (ref 3.5–5.1)
SODIUM: 142 mmol/L (ref 135–145)
TOTAL PROTEIN: 8.3 g/dL — AB (ref 6.5–8.1)
Total Bilirubin: 1 mg/dL (ref 0.3–1.2)

## 2016-09-20 LAB — PHOSPHORUS: PHOSPHORUS: 3.3 mg/dL (ref 2.5–4.6)

## 2016-09-20 LAB — BASIC METABOLIC PANEL
ANION GAP: 6 (ref 5–15)
BUN: 10 mg/dL (ref 6–20)
CALCIUM: 8.9 mg/dL (ref 8.9–10.3)
CO2: 25 mmol/L (ref 22–32)
CREATININE: 0.76 mg/dL (ref 0.44–1.00)
Chloride: 106 mmol/L (ref 101–111)
Glucose, Bld: 112 mg/dL — ABNORMAL HIGH (ref 65–99)
Potassium: 3.2 mmol/L — ABNORMAL LOW (ref 3.5–5.1)
SODIUM: 137 mmol/L (ref 135–145)

## 2016-09-20 LAB — GLUCOSE, CAPILLARY: Glucose-Capillary: 84 mg/dL (ref 65–99)

## 2016-09-20 LAB — MAGNESIUM: MAGNESIUM: 1.7 mg/dL (ref 1.7–2.4)

## 2016-09-20 MED ORDER — ESOMEPRAZOLE MAGNESIUM 40 MG PO CPDR: 40.0000 mg | DELAYED_RELEASE_CAPSULE | Freq: Every day | ORAL | 0 refills | Status: AC

## 2016-09-20 MED ORDER — OXYCODONE HCL 5 MG PO TABS: 5.0000 mg | ORAL_TABLET | ORAL | Status: DC | PRN

## 2016-09-20 MED ORDER — MAGNESIUM CITRATE PO SOLN
1.0000 | Freq: Once | ORAL | Status: DC | PRN
  Filled 2016-09-20: qty 296

## 2016-09-20 MED ORDER — MECLIZINE HCL 25 MG PO TABS: 25.0000 mg | ORAL_TABLET | Freq: Three times a day (TID) | ORAL | 0 refills | Status: AC | PRN

## 2016-09-20 MED ORDER — LISINOPRIL 10 MG PO TABS: 10.0000 mg | ORAL_TABLET | Freq: Every day | ORAL | 0 refills | Status: AC

## 2016-09-20 MED ORDER — LORAZEPAM 2 MG/ML IJ SOLN
0.5000 mg | Freq: Once | INTRAMUSCULAR | Status: AC
  Administered 2016-09-20: 0.5 mg via INTRAVENOUS
  Filled 2016-09-20: qty 1

## 2016-09-20 MED ORDER — SODIUM CHLORIDE 0.9 % IV SOLN
INTRAVENOUS | Status: DC
  Administered 2016-09-20: 03:00:00 via INTRAVENOUS

## 2016-09-20 MED ORDER — PROMETHAZINE HCL 25 MG/ML IJ SOLN: 25.0000 mg | Freq: Four times a day (QID) | INTRAMUSCULAR | Status: DC | PRN

## 2016-09-20 MED ORDER — MECLIZINE HCL 25 MG PO TABS
25.0000 mg | ORAL_TABLET | Freq: Once | ORAL | Status: AC
  Administered 2016-09-20: 25 mg via ORAL
  Filled 2016-09-20 (×2): qty 1

## 2016-09-20 MED ORDER — ONDANSETRON HCL 4 MG PO TABS: 4.0000 mg | ORAL_TABLET | Freq: Four times a day (QID) | ORAL | Status: DC | PRN

## 2016-09-20 MED ORDER — SODIUM CHLORIDE 0.9 % IV SOLN
30.0000 meq | Freq: Once | INTRAVENOUS | Status: AC
  Administered 2016-09-20: 30 meq via INTRAVENOUS
  Filled 2016-09-20: qty 15

## 2016-09-20 MED ORDER — PREDNISOLONE ACETATE 1 % OP SUSP: 1.0000 [drp] | Freq: Three times a day (TID) | OPHTHALMIC | 0 refills | Status: DC

## 2016-09-20 MED ORDER — METFORMIN HCL 500 MG PO TABS: 500.0000 mg | ORAL_TABLET | Freq: Two times a day (BID) | ORAL | 0 refills | Status: AC

## 2016-09-20 MED ORDER — BISACODYL 5 MG PO TBEC: 5.0000 mg | DELAYED_RELEASE_TABLET | Freq: Every day | ORAL | Status: DC | PRN

## 2016-09-20 MED ORDER — MECLIZINE HCL 25 MG PO TABS: 25.0000 mg | ORAL_TABLET | Freq: Three times a day (TID) | ORAL | Status: DC | PRN

## 2016-09-20 MED ORDER — SENNOSIDES-DOCUSATE SODIUM 8.6-50 MG PO TABS: 1.0000 | ORAL_TABLET | Freq: Every evening | ORAL | Status: DC | PRN

## 2016-09-20 MED ORDER — ALBUTEROL SULFATE (2.5 MG/3ML) 0.083% IN NEBU: 2.5000 mg | INHALATION_SOLUTION | Freq: Four times a day (QID) | RESPIRATORY_TRACT | Status: DC | PRN

## 2016-09-20 MED ORDER — ACETAMINOPHEN 650 MG RE SUPP: 650.0000 mg | Freq: Four times a day (QID) | RECTAL | Status: DC | PRN

## 2016-09-20 MED ORDER — SODIUM CHLORIDE 0.9 % IV SOLN
1000.0000 mL | Freq: Once | INTRAVENOUS | Status: AC
  Administered 2016-09-20: 1000 mL via INTRAVENOUS

## 2016-09-20 MED ORDER — ONDANSETRON HCL 4 MG/2ML IJ SOLN: 4.0000 mg | Freq: Four times a day (QID) | INTRAMUSCULAR | Status: DC | PRN

## 2016-09-20 MED ORDER — IPRATROPIUM BROMIDE 0.02 % IN SOLN: 0.5000 mg | Freq: Four times a day (QID) | RESPIRATORY_TRACT | Status: DC | PRN

## 2016-09-20 MED ORDER — ACETAMINOPHEN 325 MG PO TABS
650.0000 mg | ORAL_TABLET | Freq: Four times a day (QID) | ORAL | Status: DC | PRN
  Administered 2016-09-20 (×2): 650 mg via ORAL
  Filled 2016-09-20 (×2): qty 2

## 2016-09-20 NOTE — ED Notes (Signed)
Pt was seen yesterday in the ER and admitted and discharged this morning - pt states her head is spinning and she has been vomiting (vomited 1 time since discharge) - pt reports that she is back in the er due to "head spinning" and unable to open eyes without experiencing nausea and vomiting

## 2016-09-20 NOTE — ED Triage Notes (Signed)
Pt returns to ED w/ c/o "same thing that I had".  Pt reports n/v/d and dizziness.  Pt sts that she was seen in ED last night and admitted, D/C this AM.  Pt sts that when she was D/C she was feeling better, sts "I could walk".  Pt sts s/s returned 3 hours ago, denies having filled/taken prescriptions.  Resp even and unlabored, NAD.

## 2016-09-20 NOTE — Discharge Summary (Signed)
[facility] NAME: Marie Cline    MR#:  [id number]  DATE OF BIRTH:  12/22/1960  DATE OF ADMISSION:  09/19/2016 ADMITTING PHYSICIAN: Ubaldo Glassing Hugelmeyer, DO  DATE OF DISCHARGE: 09/20/2016  PRIMARY CARE PHYSICIAN: Pcp Not In System    ADMISSION DIAGNOSIS:  Hypokalemia [E87.6] Dizziness [R42] Nausea vomiting and diarrhea [R11.2, R19.7]  DISCHARGE DIAGNOSIS:  Active Problems:   Intractable vomiting with nausea   SECONDARY DIAGNOSIS:   Past Medical History:  Diagnosis Date  . Cancer [facility])    Breast Cancer  . Diabetes mellitus without complication ([place])   . Hypertension     HOSPITAL COURSE:   56 y.o. female with a history of fibromyalgia, diabetes, hypertension, breast cancerw being admitted with:  1. Intractable nausea and vomiting  secondary to gastroenteritis: This is resolved.A  2 Hypokalemia, mild and secondary to #1  3. Diabetes: Patient will continue metformin and ADA diet 4. Essential hypertension: Continue lisinopril   DISCHARGE CONDITIONS AND DIET:   Table for discharge on ADA heart healthy diet  CONSULTS OBTAINED:    DRUG ALLERGIES:   Allergies  Allergen Reactions  . Iodine Solution [Povidone Iodine] Itching and Swelling    DISCHARGE MEDICATIONS:   Current Discharge Medication List    START taking these medications   Details  esomeprazole (NEXIUM) 40 MG capsule Take 1 capsule (40 mg total) by mouth daily at 12 noon. Qty: 30 capsule, Refills: 0    lisinopril (PRINIVIL) 10 MG tablet Take 1 tablet (10 mg total) by mouth daily. Qty: 30 tablet, Refills: 0    metFORMIN (GLUCOPHAGE) 500 MG tablet Take 1 tablet (500 mg total) by mouth 2 (two) times daily with a meal. Qty: 60 tablet, Refills: 0    prednisoLONE acetate (PRED FORTE) 1 % ophthalmic suspension Place 1 drop into both eyes 3 (three) times daily. Qty: 5 mL, Refills: 0          Today   CHIEF COMPLAINT:  Issue doing better this  morning. Dizziness has resolved.  VITAL SIGNS:  Blood pressure 126/61, pulse 82, temperature 98.5 F (36.9 C), temperature source Oral, resp. rate 20, height 5\' 7"  (1.702 m), weight 99.6 kg (219 lb 9.6 oz), SpO2 99 %.   REVIEW OF SYSTEMS:  Review of Systems  Constitutional: Negative.  Negative for chills, fever and malaise/fatigue.  HENT: Negative.  Negative for ear discharge, ear pain, hearing loss, nosebleeds and sore throat.   Eyes: Negative.  Negative for blurred vision and pain.  Respiratory: Negative.  Negative for cough, hemoptysis, shortness of breath and wheezing.   Cardiovascular: Negative.  Negative for chest pain, palpitations and leg swelling.  Gastrointestinal: Negative.  Negative for abdominal pain, blood in stool, diarrhea, nausea and vomiting.  Genitourinary: Negative.  Negative for dysuria.  Musculoskeletal: Negative.  Negative for back pain.  Skin: Negative.   Neurological: Negative for dizziness, tremors, speech change, focal weakness, seizures and headaches.  Endo/Heme/Allergies: Negative.  Does not bruise/bleed easily.  Psychiatric/Behavioral: Negative.  Negative for depression, hallucinations and suicidal ideas.     PHYSICAL EXAMINATION:  GENERAL:  56 y.o.-year-old patient lying in the bed with no acute distress.  NECK:  Supple, no jugular venous distention. No thyroid enlargement, no tenderness.  LUNGS: Normal breath sounds bilaterally, no wheezing, rales,rhonchi  No use of accessory muscles of respiration.  CARDIOVASCULAR: S1, S2 normal. No murmurs, rubs, or gallops.  ABDOMEN: Soft, non-tender, non-distended. Bowel sounds present. No organomegaly or mass.  EXTREMITIES: No  pedal edema, cyanosis, or clubbing.  PSYCHIATRIC: The patient is alert and oriented x 3.  SKIN: No obvious rash, lesion, or ulcer.   DATA REVIEW:   CBC  Recent Labs Lab 09/20/16 0315  WBC 10.9  HGB 11.7*  HCT 35.3  PLT 227    Chemistries   Recent Labs Lab 09/20/16 0315   NA 142  K 3.7  CL 109  CO2 28  GLUCOSE 102*  BUN 11  CREATININE 0.68  CALCIUM 8.9  MG 1.7  AST 35  ALT 24  ALKPHOS 62  BILITOT 1.0    Cardiac Enzymes  Recent Labs Lab 09/19/16 1516  TROPONINI <0.03    Microbiology Results  @MICRORSLT48 @  RADIOLOGY:  Ct Head Wo Contrast  Result Date: 09/19/2016 CLINICAL DATA:  Sudden onset of nausea and vomiting.  Dizziness. EXAM: CT HEAD WITHOUT CONTRAST TECHNIQUE: Contiguous axial images were obtained from the base of the skull through the vertex without intravenous contrast. COMPARISON:  None. FINDINGS: Brain: No evidence of acute infarction, hemorrhage, hydrocephalus, extra-axial collection or mass lesion/mass effect. Normal cerebral volume. No white matter disease. Vascular: Mild vascular calcification in the carotid siphons. Skull: Calvarium is intact. There is mild dural thickening over the convexity as well as alongside the anterior clinoids. No calcified or ossified mass to suggest meningioma. Sinuses/Orbits: Negative. Other: None. IMPRESSION: Negative exam. No acute or focal intracranial abnormality. No evidence for bleed or CVA. Electronically Signed   By: Staci Righter M.D.   On: 09/19/2016 18:56   Ct Abdomen Pelvis W Contrast  Result Date: 09/19/2016 CLINICAL DATA:  Acute onset of nausea, vomiting and diarrhea. Dizziness. Initial encounter. EXAM: CT ABDOMEN AND PELVIS WITH CONTRAST TECHNIQUE: Multidetector CT imaging of the abdomen and pelvis was performed using the standard protocol following bolus administration of intravenous contrast. CONTRAST:  161mL ISOVUE-300 IOPAMIDOL (ISOVUE-300) INJECTION 61% COMPARISON:  None. FINDINGS: Lower chest: Minimal bibasilar atelectasis is noted. The visualized portions of the mediastinum are unremarkable. Hepatobiliary: The liver is unremarkable in appearance. The patient is status post cholecystectomy, with clips noted at the gallbladder fossa. The common bile duct remains normal in caliber.  Pancreas: The pancreas is within normal limits. Spleen: The spleen is unremarkable in appearance. Adrenals/Urinary Tract: The adrenal glands are unremarkable in appearance. A small left renal cyst is noted. There is no evidence of hydronephrosis. No renal or ureteral stones are identified. No perinephric stranding is seen. Stomach/Bowel: The stomach is unremarkable in appearance. The small bowel is within normal limits. The appendix is normal in caliber, without evidence of appendicitis. The colon is unremarkable in appearance. Vascular/Lymphatic: The abdominal aorta is unremarkable in appearance. The inferior vena cava is grossly unremarkable. No retroperitoneal lymphadenopathy is seen. No pelvic sidewall lymphadenopathy is identified. Reproductive: The bladder is mildly distended and grossly unremarkable. The patient is status post hysterectomy. No suspicious adnexal masses are seen. Other: No additional soft tissue abnormalities are seen. Musculoskeletal: No acute osseous abnormalities are identified. The visualized musculature is unremarkable in appearance. IMPRESSION: 1. No acute abnormality seen to explain the patient's symptoms. 2. Small left renal cyst noted. Electronically Signed   By: Garald Balding M.D.   On: 09/19/2016 18:58      Current Discharge Medication List    START taking these medications   Details  esomeprazole (NEXIUM) 40 MG capsule Take 1 capsule (40 mg total) by mouth daily at 12 noon. Qty: 30 capsule, Refills: 0    lisinopril (PRINIVIL) 10 MG tablet Take 1 tablet (10 mg total)  by mouth daily. Qty: 30 tablet, Refills: 0    metFORMIN (GLUCOPHAGE) 500 MG tablet Take 1 tablet (500 mg total) by mouth 2 (two) times daily with a meal. Qty: 60 tablet, Refills: 0    prednisoLONE acetate (PRED FORTE) 1 % ophthalmic suspension Place 1 drop into both eyes 3 (three) times daily. Qty: 5 mL, Refills: 0         Management plans discussed with the patient and she is in  agreement. Stable for discharge home  Patient should follow up with PCP [place]  CODE STATUS:     Code Status Orders        Start     Ordered   09/20/16 0220  Full code  Continuous     09/20/16 0219    Code Status History    Date Active Date Inactive Code Status Order ID Comments User Context   This patient has a current code status but no historical code status.      TOTAL TIME TAKING CARE OF THIS PATIENT: 37 minutes.    Note: This dictation was prepared with Dragon dictation along with smaller phrase technology. Any transcriptional errors that result from this process are unintentional.  Lennell Shanks M.D on 09/20/2016 at 9:46 AM  Between 7am to 6pm - Pager - [phone] After 6pm go to [email or web address] - password [email or web address] [facility]  Office  [phone]  CC: Primary care physician; Pcp Not In System

## 2016-09-20 NOTE — Progress Notes (Signed)
A &O. Room air. Pt reported a HA and received tylenol. Takes meds ok. Up in the room and tolerated it well. Pt refused to ambulate in the hallway and reported no dizziness. Iv removed. Prescriptions given to pt.  Discharge instructions reviewed with pt. Pt has no further concerns at this time.

## 2016-09-20 NOTE — Plan of Care (Signed)
Problem: Education: Goal: Knowledge of [facility] Education information/materials will improve Outcome: Progressing Pt likes to be called Marie Cline  Past Medical History:  Diagnosis Date  . Cancer [facility])    Breast Cancer  . Diabetes mellitus without complication ([place])   . Hypertension    Pt is well controlled with home medications

## 2016-09-20 NOTE — ED Provider Notes (Signed)
[facility] Emergency Department Provider Note   ____________________________________________    I have reviewed the triage vital signs and the nursing notes.   HISTORY  Chief Complaint Nausea and Dizziness     HPI Marie Cline is a 56 y.o. female who presents with dizziness nausea and vomiting. Patient was admitted last night for similar symptoms and was feeling better when she was discharged this morning but reports her symptoms returned. She reports a sensation of the room spinning especially when her eyes are open or when she moves her head. It is similar to her symptoms yesterday. She denies focal weakness or headache. She had CT abdomen and pelvis and CT head last night which were normal. No change in vision.   Past Medical History:  Diagnosis Date  . Cancer [facility])    Breast Cancer  . Diabetes mellitus without complication ([place])   . Hypertension     Patient Active Problem List   Diagnosis Date Noted  . Intractable vomiting with nausea 09/19/2016    Past Surgical History:  Procedure Laterality Date  . ABDOMINAL HYSTERECTOMY    . BACK SURGERY    . BREAST SURGERY Left   . CHOLECYSTECTOMY      Prior to Admission medications   Medication Sig Start Date End Date Taking? Authorizing Provider  esomeprazole (NEXIUM) 40 MG capsule Take 1 capsule (40 mg total) by mouth daily at 12 noon. 09/20/16   Bettey Costa, MD  lisinopril (PRINIVIL) 10 MG tablet Take 1 tablet (10 mg total) by mouth daily. 09/20/16   Bettey Costa, MD  meclizine (ANTIVERT) 25 MG tablet Take 1 tablet (25 mg total) by mouth 3 (three) times daily as needed for dizziness. 09/20/16   Lavonia Drafts, MD  metFORMIN (GLUCOPHAGE) 500 MG tablet Take 1 tablet (500 mg total) by mouth 2 (two) times daily with a meal. 09/20/16   Bettey Costa, MD  prednisoLONE acetate (PRED FORTE) 1 % ophthalmic suspension Place 1 drop into both eyes 3 (three) times daily. 09/20/16   Bettey Costa, MD      Allergies Iodine solution [povidone iodine]  No family history on file.  Social History Social History  Substance Use Topics  . Smoking status: Never Smoker  . Smokeless tobacco: Never Used  . Alcohol use No    Review of Systems  Constitutional: No fever/chills Eyes: No visual changes.  ENT: No tinnitus Cardiovascular: Denies chest pain. Respiratory: Denies shortness of breath. Gastrointestinal: No abdominal pain.Positive nausea and vomiting  Musculoskeletal: Negative for back pain. Skin: Negative for rash. Neurological: Negative for headaches or focal weakness  10-point ROS otherwise negative.  ____________________________________________   PHYSICAL EXAM:  VITAL SIGNS: ED Triage Vitals  Enc Vitals Group     BP 09/20/16 1950 (!) 164/90     Pulse Rate 09/20/16 1950 74     Resp 09/20/16 1950 18     Temp 09/20/16 1950 98 F (36.7 C)     Temp Source 09/20/16 1950 Oral     SpO2 09/20/16 1950 98 %     Weight 09/20/16 1951 219 lb (99.3 kg)     Height 09/20/16 1951 5\' 7"  (1.702 m)     Head Circumference --      Peak Flow --      Pain Score 09/20/16 1952 0     Pain Loc --      Pain Edu? --      Excl. in [place]? --     Constitutional: Alert and  oriented. No acute distress.  Eyes: Conjunctivae are normal. PERRLA, EOMI Head: Atraumatic. Nose: No congestion/rhinnorhea. Mouth/Throat: Mucous membranes are moist.   Neck:  Painless ROM Cardiovascular: Normal rate, regular rhythm. Grossly normal heart sounds.  Good peripheral circulation. Respiratory: Normal respiratory effort.  No retractions. Lungs CTAB. Gastrointestinal: Soft and nontender. No distention.  No CVA tenderness. Genitourinary: deferred Musculoskeletal: Warm and well perfused Neurologic:  Normal speech and language. No gross focal neurologic deficits are appreciated.  Skin:  Skin is warm, dry and intact. No rash noted. Psychiatric: Mood and affect are normal. Speech and behavior are  normal.  ____________________________________________   LABS (all labs ordered are listed, but only abnormal results are displayed)  Labs Reviewed  BASIC METABOLIC PANEL - Abnormal; Notable for the following:       Result Value   Potassium 3.2 (*)    Glucose, Bld 112 (*)    All other components within normal limits  CBC - Abnormal; Notable for the following:    Hemoglobin 11.5 (*)    MCV 76.8 (*)    MCH 25.3 (*)    RDW 17.3 (*)    All other components within normal limits  URINALYSIS, COMPLETE (UACMP) WITH MICROSCOPIC - Abnormal; Notable for the following:    Color, Urine YELLOW (*)    APPearance CLEAR (*)    Squamous Epithelial / LPF 0-5 (*)    All other components within normal limits  CBG MONITORING, ED   ____________________________________________  EKG  ED ECG REPORT I, Lavonia Drafts, the attending physician, personally viewed and interpreted this ECG.  Date: 09/20/2016  Rate: 74 Rhythm: normal sinus rhythm QRS Axis: normal Intervals: normal ST/T Wave abnormalities: normal Conduction Disturbances: none Narrative Interpretation: unremarkable  ____________________________________________  RADIOLOGY  None ____________________________________________   PROCEDURES  Procedure(s) performed: No    Critical Care performed: No ____________________________________________   INITIAL IMPRESSION / ASSESSMENT AND PLAN / ED COURSE  Pertinent labs & imaging results that were available during my care of the patient were reviewed by me and considered in my medical decision making (see chart for details).  Patient's symptoms are consistent with vertigo, suspect BPV. No exam findings or history elements to suggest CVA or TIA. We will treat with IV fluids, meclizine, 0.5 mg of Ativan and reevaluate.   ----------------------------------------- 11:03 PM on 09/20/2016 -----------------------------------------  Patient reports feeling significantly better after  treatment. She still has mild vertiginous symptoms want to go home. I offered admission given continued symptoms but she refused and she states she'll be much more calm for home and has ENT follow-up arranged.. She knows to return if any change in her symptoms or worsening    ____________________________________________   FINAL CLINICAL IMPRESSION(S) / ED DIAGNOSES  Final diagnoses:  Vertigo      NEW MEDICATIONS STARTED DURING THIS VISIT:  Discharge Medication List as of 09/20/2016 10:51 PM    START taking these medications   Details  meclizine (ANTIVERT) 25 MG tablet Take 1 tablet (25 mg total) by mouth 3 (three) times daily as needed for dizziness., Starting Fri 09/20/2016, Print         Note:  This document was prepared using Dragon voice recognition software and may include unintentional dictation errors.    Lavonia Drafts, MD 09/20/16 [phone]

## 2016-09-21 LAB — HIV ANTIBODY (ROUTINE TESTING W REFLEX): HIV Screen 4th Generation wRfx: NONREACTIVE

## 2016-09-22 LAB — URINE CULTURE: Culture: >=100000 — AB

## 2017-01-15 ENCOUNTER — Emergency Department
Admission: EM | Admit: 2017-01-15 | Discharge: 2017-01-15 | Disposition: A | Discharge status: Home / Self Care | Payer: No Typology Code available for payment source | Attending: Student in an Organized Health Care Education/Training Program | Admitting: Student in an Organized Health Care Education/Training Program

## 2017-01-15 ENCOUNTER — Encounter: Payer: Self-pay | Admitting: Physician Assistant

## 2017-01-15 ENCOUNTER — Emergency Department: Payer: No Typology Code available for payment source

## 2017-01-15 DIAGNOSIS — Z79899 Other long term (current) drug therapy: Secondary | ICD-10-CM | POA: Diagnosis not present

## 2017-01-15 DIAGNOSIS — E119 Type 2 diabetes mellitus without complications: Secondary | ICD-10-CM | POA: Insufficient documentation

## 2017-01-15 DIAGNOSIS — I10 Essential (primary) hypertension: Secondary | ICD-10-CM | POA: Diagnosis not present

## 2017-01-15 DIAGNOSIS — S39012A Strain of muscle, fascia and tendon of lower back, initial encounter: Secondary | ICD-10-CM | POA: Insufficient documentation

## 2017-01-15 DIAGNOSIS — Y929 Unspecified place or not applicable: Secondary | ICD-10-CM | POA: Diagnosis not present

## 2017-01-15 DIAGNOSIS — Z7984 Long term (current) use of oral hypoglycemic drugs: Secondary | ICD-10-CM | POA: Diagnosis not present

## 2017-01-15 DIAGNOSIS — Y9389 Activity, other specified: Secondary | ICD-10-CM | POA: Insufficient documentation

## 2017-01-15 DIAGNOSIS — M5410 Radiculopathy, site unspecified: Secondary | ICD-10-CM

## 2017-01-15 DIAGNOSIS — Z853 Personal history of malignant neoplasm of breast: Secondary | ICD-10-CM | POA: Diagnosis not present

## 2017-01-15 DIAGNOSIS — M79605 Pain in left leg: Secondary | ICD-10-CM | POA: Diagnosis not present

## 2017-01-15 DIAGNOSIS — Y998 Other external cause status: Secondary | ICD-10-CM | POA: Diagnosis not present

## 2017-01-15 MED ORDER — KETOROLAC TROMETHAMINE 60 MG/2ML IM SOLN
30.0000 mg | Freq: Once | INTRAMUSCULAR | Status: AC
  Administered 2017-01-15: 30 mg via INTRAMUSCULAR
  Filled 2017-01-15: qty 2

## 2017-01-15 MED ORDER — KETOROLAC TROMETHAMINE 10 MG PO TABS
10.0000 mg | ORAL_TABLET | Freq: Three times a day (TID) | ORAL | 0 refills | Status: DC
Start: 2017-01-15 — End: 2020-08-10
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
## 2017-01-15 NOTE — ED Notes (Signed)
Right lumbar pain 6/10. Full ROM. No changes in bowel or bladder. NAD

## 2017-01-15 NOTE — ED Notes (Signed)
E-signature box not working. Pt verbalized understanding of discharge instructions and denied questions. 
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
## 2018-01-29 ENCOUNTER — Ambulatory Visit
Admission: EM | Admit: 2018-01-29 | Discharge: 2018-01-29 | Disposition: A | Discharge status: Other Acute Inpatient Hospital | Attending: Family Medicine | Admitting: Family Medicine

## 2018-01-29 ENCOUNTER — Ambulatory Visit (INDEPENDENT_AMBULATORY_CARE_PROVIDER_SITE_OTHER)

## 2018-01-29 ENCOUNTER — Encounter: Payer: Self-pay | Admitting: Emergency Medicine

## 2018-01-29 ENCOUNTER — Other Ambulatory Visit: Payer: Self-pay

## 2018-01-29 DIAGNOSIS — Z9049 Acquired absence of other specified parts of digestive tract: Secondary | ICD-10-CM | POA: Insufficient documentation

## 2018-01-29 DIAGNOSIS — I10 Essential (primary) hypertension: Secondary | ICD-10-CM | POA: Diagnosis not present

## 2018-01-29 DIAGNOSIS — Z801 Family history of malignant neoplasm of trachea, bronchus and lung: Secondary | ICD-10-CM | POA: Diagnosis not present

## 2018-01-29 DIAGNOSIS — J181 Lobar pneumonia, unspecified organism: Secondary | ICD-10-CM

## 2018-01-29 DIAGNOSIS — Z79899 Other long term (current) drug therapy: Secondary | ICD-10-CM | POA: Insufficient documentation

## 2018-01-29 DIAGNOSIS — Z7984 Long term (current) use of oral hypoglycemic drugs: Secondary | ICD-10-CM | POA: Insufficient documentation

## 2018-01-29 DIAGNOSIS — Z9889 Other specified postprocedural states: Secondary | ICD-10-CM | POA: Insufficient documentation

## 2018-01-29 DIAGNOSIS — R0789 Other chest pain: Secondary | ICD-10-CM

## 2018-01-29 DIAGNOSIS — Z90710 Acquired absence of both cervix and uterus: Secondary | ICD-10-CM | POA: Insufficient documentation

## 2018-01-29 DIAGNOSIS — R0602 Shortness of breath: Secondary | ICD-10-CM | POA: Diagnosis not present

## 2018-01-29 DIAGNOSIS — Z853 Personal history of malignant neoplasm of breast: Secondary | ICD-10-CM | POA: Insufficient documentation

## 2018-01-29 DIAGNOSIS — J189 Pneumonia, unspecified organism: Secondary | ICD-10-CM

## 2018-01-29 DIAGNOSIS — E119 Type 2 diabetes mellitus without complications: Secondary | ICD-10-CM | POA: Diagnosis not present

## 2018-01-29 DIAGNOSIS — Z888 Allergy status to other drugs, medicaments and biological substances status: Secondary | ICD-10-CM | POA: Insufficient documentation

## 2018-01-29 DIAGNOSIS — R05 Cough: Secondary | ICD-10-CM | POA: Diagnosis present

## 2018-01-29 LAB — CBC WITH DIFFERENTIAL/PLATELET
Basophils Absolute: 0.1 10*3/uL (ref 0–0.1)
Basophils Relative: 1 %
Eosinophils Absolute: 0.1 10*3/uL (ref 0–0.7)
Eosinophils Relative: 1 %
HCT: 29.7 % — ABNORMAL LOW (ref 35.0–47.0)
Hemoglobin: 9.8 g/dL — ABNORMAL LOW (ref 12.0–16.0)
Lymphocytes Relative: 18 %
Lymphs Abs: 2.6 10*3/uL (ref 1.0–3.6)
MCH: 24.4 pg — ABNORMAL LOW (ref 26.0–34.0)
MCHC: 32.9 g/dL (ref 32.0–36.0)
MCV: 74.1 fL — ABNORMAL LOW (ref 80.0–100.0)
Monocytes Absolute: 1.4 10*3/uL — ABNORMAL HIGH (ref 0.2–0.9)
Monocytes Relative: 10 %
Neutro Abs: 10 10*3/uL — ABNORMAL HIGH (ref 1.4–6.5)
Neutrophils Relative %: 70 %
Platelets: 217 10*3/uL (ref 150–440)
RBC: 4.01 MIL/uL (ref 3.80–5.20)
RDW: 16.5 % — ABNORMAL HIGH (ref 11.5–14.5)
WBC: 14.2 10*3/uL — ABNORMAL HIGH (ref 3.6–11.0)

## 2018-01-29 LAB — COMPREHENSIVE METABOLIC PANEL
ALT: 17 U/L (ref 0–44)
AST: 23 U/L (ref 15–41)
Albumin: 3.3 g/dL — ABNORMAL LOW (ref 3.5–5.0)
Alkaline Phosphatase: 67 U/L (ref 38–126)
Anion gap: 12 (ref 5–15)
BUN: 12 mg/dL (ref 6–20)
CO2: 25 mmol/L (ref 22–32)
Calcium: 8.7 mg/dL — ABNORMAL LOW (ref 8.9–10.3)
Chloride: 98 mmol/L (ref 98–111)
Creatinine, Ser: 0.97 mg/dL (ref 0.44–1.00)
GFR calc Af Amer: >60 mL/min (ref >60)
GFR calc non Af Amer: >60 mL/min (ref >60)
Glucose, Bld: 132 mg/dL — ABNORMAL HIGH (ref 70–99)
Potassium: 3.3 mmol/L — ABNORMAL LOW (ref 3.5–5.1)
Sodium: 135 mmol/L (ref 135–145)
Total Bilirubin: 0.6 mg/dL (ref 0.3–1.2)
Total Protein: 8.4 g/dL — ABNORMAL HIGH (ref 6.5–8.1)

## 2018-01-29 MED ORDER — KETOROLAC TROMETHAMINE 30 MG/ML IJ SOLN: 30.0000 mg | Freq: Once | INTRAMUSCULAR | Status: DC

## 2018-01-29 MED ORDER — ACETAMINOPHEN 325 MG PO TABS
650.0000 mg | ORAL_TABLET | Freq: Once | ORAL | Status: AC
  Administered 2018-01-29: 650 mg via ORAL

## 2018-01-29 MED ORDER — KETOROLAC TROMETHAMINE 60 MG/2ML IM SOLN
30.0000 mg | Freq: Once | INTRAMUSCULAR | Status: AC
  Administered 2018-01-29: 30 mg via INTRAMUSCULAR

## 2018-01-29 NOTE — ED Provider Notes (Signed)
[facility]    CSN: [id number] Arrival date & time: 01/29/18  1431     History   Chief Complaint Chief Complaint  Patient presents with  . Cough    HPI Marie Cline is a 57 y.o. female.   HPI  -year-old female presents with fever currently 101.6 with a cough with episodes of vomiting.  She is currently returning from [place] where she went to visit her mother with lung cancer.  She lives in [place]. she had been seen by her primary care physician on 01/26/2018.  At that time a CT of her chest have been performed in July  because of breast cancer that she had previously and the finding of pulmonary nodules.  Evidently the pulmonary nodule also had not progressed and she was given a release.  Her family physician did not feel the need to have yearly updated CT scans.  She then traveled from [place] to our area to visit friends and her daughter.  States last night she started feeling a lump in her throat that she thought may have been fish being stuck and eventually went down to her stomach.  Then she was awakened last night by severe retrosternal pain and shortness of breath that caused her to get up and walk around.  Continues to have the chest pain at the present time graded right now is a 4-5 out of 10.  Had some associated nausea and vomiting as well. O 2 sats on room air 96%.  Pulse rate of 105 temperature 101.6.  Blood pressure 101/70. CoMorbidities include diabetes mellitus hypertension And breast cancer.  She has no history of coronary artery disease in the past.  Her family physician had placed her on doxycycline for URI she has been taking for 3 days.       Past Medical History:  Diagnosis Date  . Cancer [facility])    Breast Cancer  . Diabetes mellitus without complication ([place])   . Hypertension     Patient Active Problem List   Diagnosis Date Noted  . Intractable vomiting with nausea 09/19/2016    Past Surgical History:  Procedure Laterality  Date  . ABDOMINAL HYSTERECTOMY    . BACK SURGERY    . BREAST SURGERY Left   . CHOLECYSTECTOMY      OB History   None      Home Medications    Prior to Admission medications   Medication Sig Start Date End Date Taking? Authorizing Provider  doxycycline (VIBRAMYCIN) 100 MG capsule Take 100 mg by mouth 2 (two) times daily.   Yes [provider]  ketorolac (TORADOL) 10 MG tablet Take 1 tablet (10 mg total) by mouth every 8 (eight) hours. 01/15/17  Yes Menshew, Dannielle Karvonen, PA-C  lisinopril (PRINIVIL) 10 MG tablet Take 1 tablet (10 mg total) by mouth daily. 09/20/16  Yes Bettey Costa, MD  meclizine (ANTIVERT) 25 MG tablet Take 1 tablet (25 mg total) by mouth 3 (three) times daily as needed for dizziness. 09/20/16  Yes Lavonia Drafts, MD  metFORMIN (GLUCOPHAGE) 500 MG tablet Take 1 tablet (500 mg total) by mouth 2 (two) times daily with a meal. 09/20/16  Yes Mody, Sital, MD  pantoprazole (PROTONIX) 40 MG tablet Take 40 mg by mouth daily.   Yes [provider]  esomeprazole (NEXIUM) 40 MG capsule Take 1 capsule (40 mg total) by mouth daily at 12 noon. 09/20/16   Bettey Costa, MD  prednisoLONE acetate (PRED FORTE) 1 % ophthalmic  suspension Place 1 drop into both eyes 3 (three) times daily. 09/20/16   Bettey Costa, MD    Family History Family History  Problem Relation Age of Onset  . Cancer Mother     Social History Social History   Tobacco Use  . Smoking status: Never Smoker  . Smokeless tobacco: Never Used  Substance Use Topics  . Alcohol use: No  . Drug use: No     Allergies   Iodine solution [povidone iodine]   Review of Systems Review of Systems  Constitutional: Positive for activity change, appetite change, chills, fatigue and fever.  Respiratory: Positive for cough and shortness of breath.   Cardiovascular: Positive for chest pain.  Gastrointestinal: Positive for abdominal pain, nausea and vomiting.  All other systems reviewed and are  negative.    Physical Exam Triage Vital Signs ED Triage Vitals  Enc Vitals Group     BP 01/29/18 1511 101/70     Pulse Rate 01/29/18 1511 (!) 105     Resp 01/29/18 1511 18     Temp 01/29/18 1511 (!) 101.6 F (38.7 C)     Temp Source 01/29/18 1511 Oral     SpO2 01/29/18 1511 96 %     Weight 01/29/18 1508 230 lb (104.3 kg)     Height 01/29/18 1508 5\' 7"  (1.702 m)     Head Circumference --      Peak Flow --      Pain Score 01/29/18 1508 0     Pain Loc --      Pain Edu? --      Excl. in [place]? --    No data found.  Updated Vital Signs BP 101/70 (BP Location: Right Arm)   Pulse (!) 105   Temp (!) 102.9 F (39.4 C) (Oral)   Resp 18   Ht 5\' 7"  (1.702 m)   Wt 230 lb (104.3 kg)   SpO2 96%   BMI 36.02 kg/m   Visual Acuity Right Eye Distance:   Left Eye Distance:   Bilateral Distance:    Right Eye Near:   Left Eye Near:    Bilateral Near:     Physical Exam  Constitutional: She is oriented to person, place, and time. She appears well-developed and well-nourished. No distress.  HENT:  Head: Normocephalic.  Eyes: Pupils are equal, round, and reactive to light.  Neck: Normal range of motion.  Cardiovascular: Normal rate, regular rhythm and normal heart sounds.  Pulmonary/Chest: Effort normal and breath sounds normal.  Abdominal: Soft. Bowel sounds are normal.  Musculoskeletal: Normal range of motion.  Neurological: She is alert and oriented to person, place, and time.  Skin: Skin is warm and dry. She is not diaphoretic.  Psychiatric: She has a normal mood and affect. Her behavior is normal. Judgment and thought content normal.  Nursing note and vitals reviewed.    UC Treatments / Results  Labs (all labs ordered are listed, but only abnormal results are displayed) Labs Reviewed  CBC WITH DIFFERENTIAL/PLATELET - Abnormal; Notable for the following components:      Result Value   WBC 14.2 (*)    Hemoglobin 9.8 (*)    HCT 29.7 (*)    MCV 74.1 (*)    MCH 24.4 (*)     RDW 16.5 (*)    Neutro Abs 10.0 (*)    Monocytes Absolute 1.4 (*)    All other components within normal limits  COMPREHENSIVE METABOLIC PANEL - Abnormal; Notable for the following components:  Potassium 3.3 (*)    Glucose, Bld 132 (*)    Calcium 8.7 (*)    Total Protein 8.4 (*)    Albumin 3.3 (*)    All other components within normal limits    EKG ED ECG REPORT   Date: 01/29/2018  EKG Time: 4:54 PM  Rate: 102  Rhythm: sinus tachycardia,  , there are no previous tracings available for comparison  Axis:Normal  Intervals:none  ST&T Change: No acute  Narrative Interpretation: Tachycardia with no acute changes seen          Radiology Dg Chest 2 View  Result Date: 01/29/2018 CLINICAL DATA:  Cough.  History of LEFT breast cancer in 2013. EXAM: CHEST - 2 VIEW COMPARISON:  None. FINDINGS: Dense opacity within the RIGHT middle lobe, consistent with pneumonia. Lungs otherwise clear. No pleural effusions seen. Heart size and mediastinal contours are within normal limits. No acute or suspicious osseous finding. IMPRESSION: Findings are consistent with RIGHT middle lobe pneumonia. Given the history of previous LEFT breast cancer, recommend follow-up chest x-ray after appropriate course of antibiotics to ensure complete resolution. Electronically Signed   By: Franki Cabot M.D.   On: 01/29/2018 16:29    Procedures Procedures (including critical care time)  Medications Ordered in UC Medications  acetaminophen (TYLENOL) tablet 650 mg (650 mg Oral Given 01/29/18 1552)    Initial Impression / Assessment and Plan / UC Course  I have reviewed the triage vital signs and the nursing notes.  Pertinent labs & imaging results that were available during my care of the patient were reviewed by me and considered in my medical decision making (see chart for details).     Discussion with the patient who is having significant chest pain from her right middle lobe pneumonia with leukocytosis with  shift   She has been on doxycycline now for 3 days without improvement.  Visiting from the [place] area.  Told her that she would likely do better with a short hospitalization where they can provide her with IV antibiotics provide her adequate pain medication until the antibiotics are effective.  Agreeable  To  Going emergency room instead of treating as an outpatient as offered to the patient.  She will contact family in the area to provide transportation. Final Clinical Impressions(s) / UC Diagnoses   Final diagnoses:  Community acquired pneumonia of right middle lobe of lung [facility])   Discharge Instructions   None    ED Prescriptions    None     Controlled Substance Prescriptions [place] Controlled Substance Registry consulted? No  Lorin Picket, [place] 01/29/18 1657

## 2018-01-29 NOTE — ED Triage Notes (Signed)
Patient stated she went to see her pulmonary MD on 08/05 and was told she had a URI and was given Doxycycline. Patient stated she has been taking this 08/05 and she has not improved. Patient states she is still coughing with episodes of vomiting.

## 2018-01-30 MED ORDER — DEXTROSE 50 % IV SOLN
12.50 | INTRAVENOUS | Status: DC
Start: ? — End: 2018-01-30

## 2018-01-30 MED ORDER — PANTOPRAZOLE SODIUM 20 MG PO TBEC
40.00 | DELAYED_RELEASE_TABLET | ORAL | Status: DC
Start: 2018-02-02 — End: 2018-01-30

## 2018-01-30 MED ORDER — ACETAMINOPHEN 500 MG PO TABS
1000.00 | ORAL_TABLET | ORAL | Status: DC
Start: 2018-01-30 — End: 2018-01-30

## 2018-01-30 MED ORDER — GENERIC EXTERNAL MEDICATION
3.00 | Status: DC
Start: 2018-01-30 — End: 2018-01-30

## 2018-01-30 MED ORDER — INSULIN LISPRO 100 UNIT/ML ~~LOC~~ SOLN
.00 | SUBCUTANEOUS | Status: DC
Start: 2018-02-02 — End: 2018-01-30

## 2018-01-30 MED ORDER — PREDNISOLONE ACETATE 1 % OP SUSP
1.00 | OPHTHALMIC | Status: DC
Start: 2018-02-02 — End: 2018-01-30

## 2018-01-30 MED ORDER — HYPROMELLOSE 0.3 % OP GEL
1.00 | OPHTHALMIC | Status: DC
Start: ? — End: 2018-01-30

## 2018-01-30 MED ORDER — ENOXAPARIN SODIUM 40 MG/0.4ML ~~LOC~~ SOLN
40.00 | SUBCUTANEOUS | Status: DC
Start: 2018-02-02 — End: 2018-01-30

## 2018-02-02 MED ORDER — ACETAMINOPHEN 500 MG PO TABS
1000.00 | ORAL_TABLET | ORAL | Status: DC
Start: ? — End: 2018-02-02

## 2018-02-02 MED ORDER — TRAMADOL HCL 50 MG PO TABS
25.00 | ORAL_TABLET | ORAL | Status: DC
Start: ? — End: 2018-02-02

## 2018-02-02 MED ORDER — GENERIC EXTERNAL MEDICATION
2.50 | Status: DC
Start: ? — End: 2018-02-02

## 2018-02-02 MED ORDER — LINEZOLID 600 MG PO TABS
600.00 | ORAL_TABLET | ORAL | Status: DC
Start: 2018-02-02 — End: 2018-02-02

## 2018-02-02 MED ORDER — CARBOXYMETHYLCELLULOSE SOD PF 1 % OP GEL
1.00 | OPHTHALMIC | Status: DC
Start: ? — End: 2018-02-02
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
## 2019-11-24 IMAGING — CR DG CHEST 2V
2 series · 2 of 2 positions shown · non-contrast
Comparison: None.

CLINICAL DATA: Cough.  History of LEFT breast cancer in 6322.

EXAM:
CHEST - 2 VIEW

[chest pa]
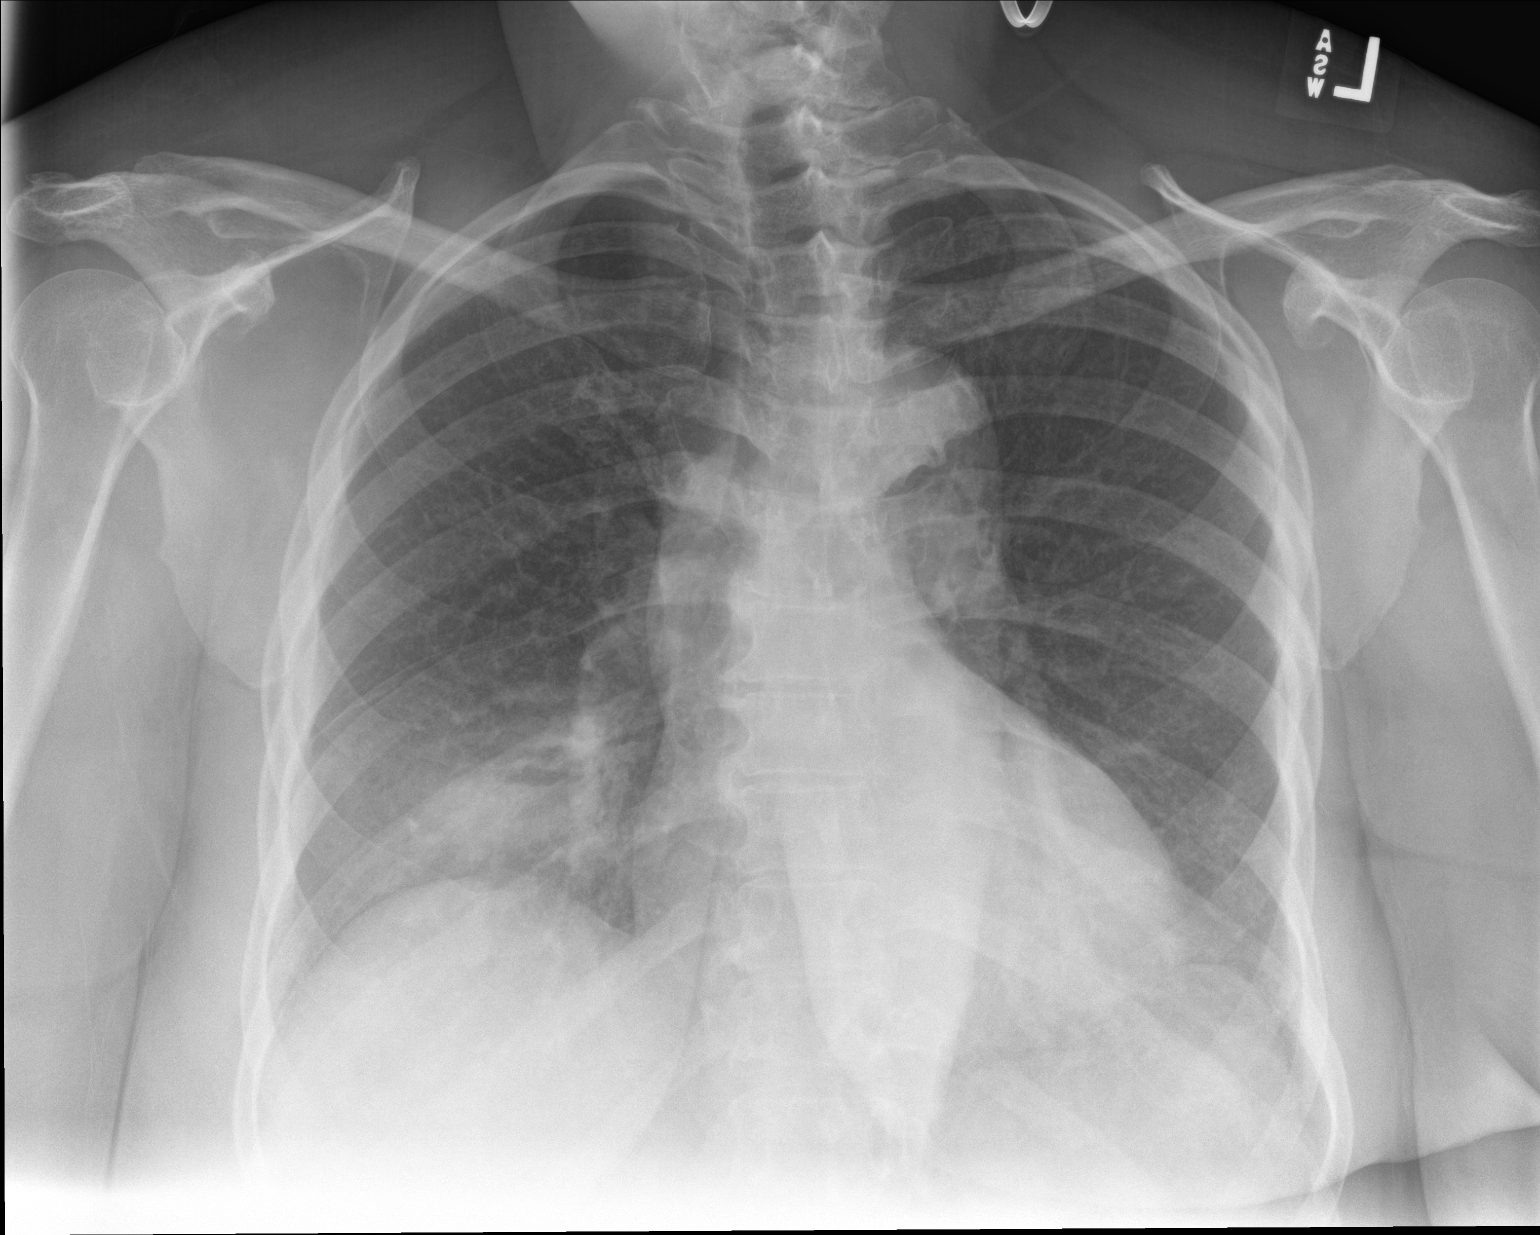

[chest lat]
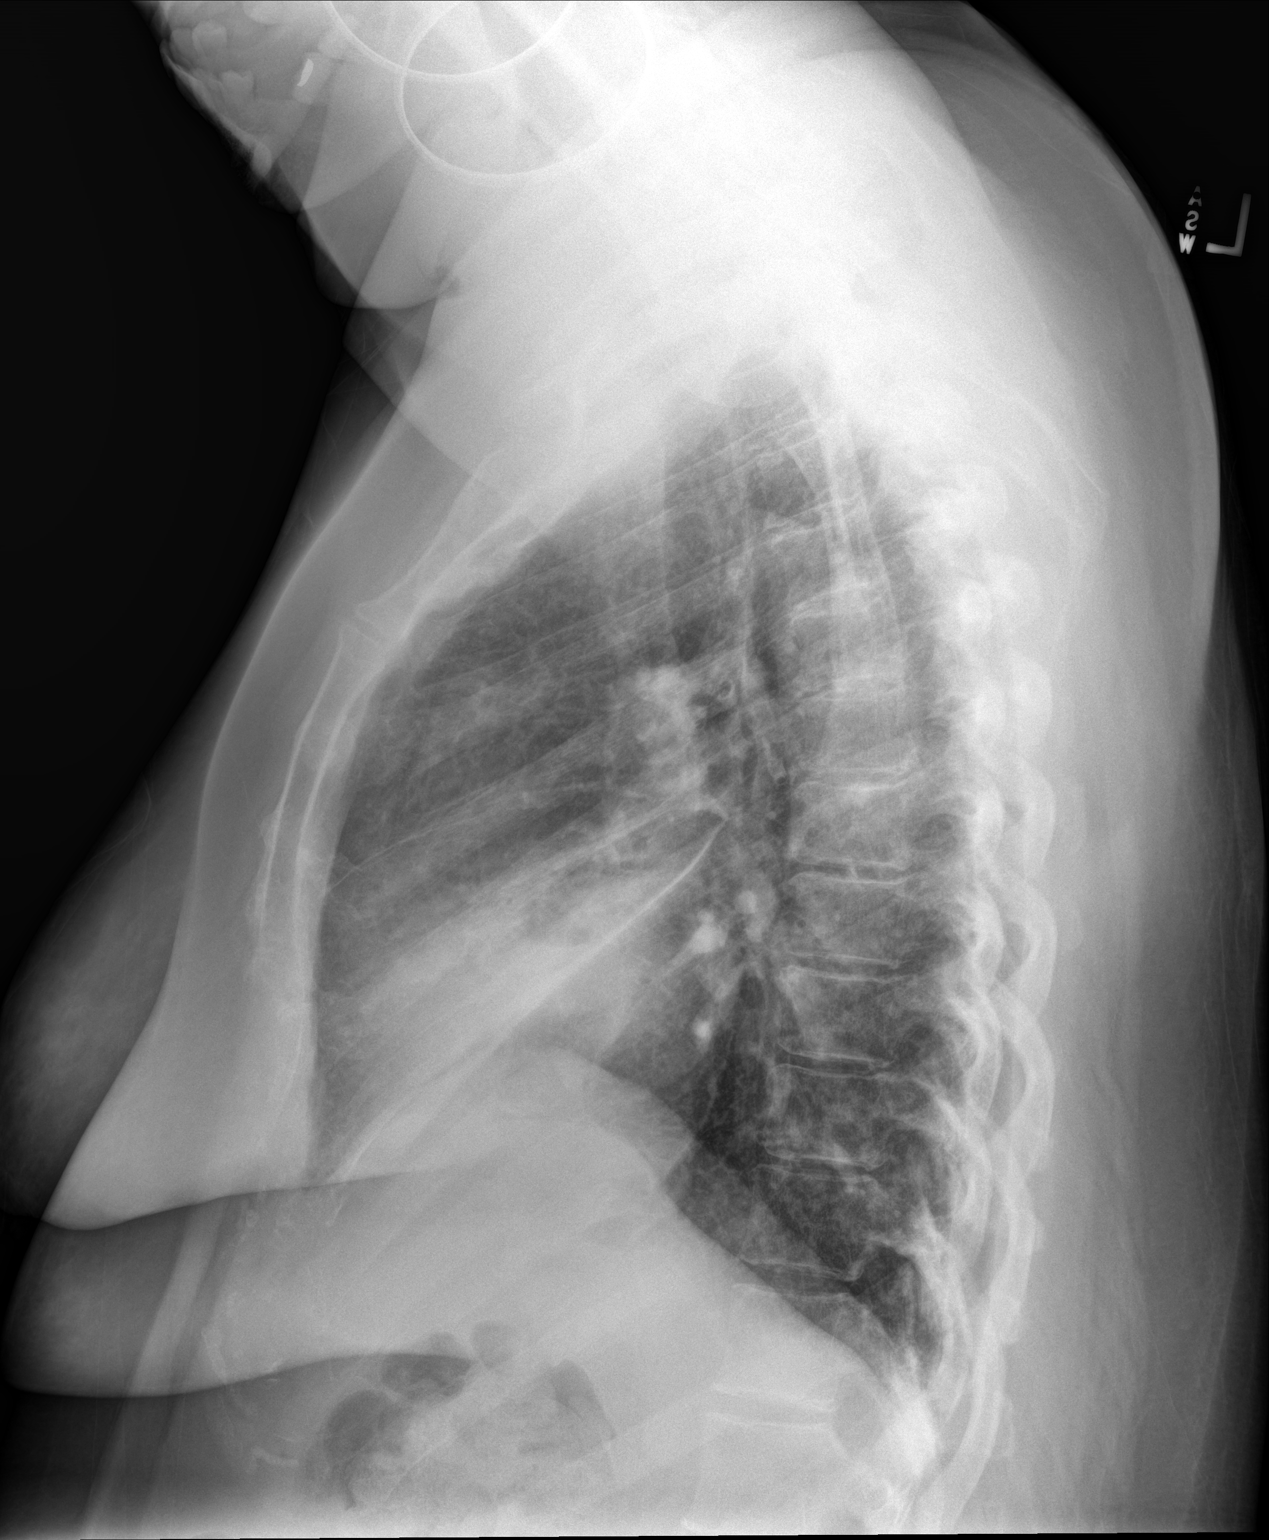

[2 of 2 positions shown; findings below may reference images not displayed]

FINDINGS: Dense opacity within the RIGHT middle lobe, consistent with
pneumonia. Lungs otherwise clear. No pleural effusions seen. Heart
size and mediastinal contours are within normal limits. No acute or
suspicious osseous finding.
IMPRESSION: Findings are consistent with RIGHT middle lobe pneumonia. Given the
history of previous LEFT breast cancer, recommend follow-up chest
x-ray after appropriate course of antibiotics to ensure complete
resolution.

## 2020-07-20 ENCOUNTER — Encounter: Payer: Self-pay | Admitting: *Deleted

## 2020-08-10 ENCOUNTER — Ambulatory Visit (INDEPENDENT_AMBULATORY_CARE_PROVIDER_SITE_OTHER): Admitting: *Deleted

## 2020-08-10 ENCOUNTER — Other Ambulatory Visit: Payer: Self-pay

## 2020-08-10 ENCOUNTER — Ambulatory Visit

## 2020-08-10 VITALS — Ht 67.5 in | Wt 227.8 lb

## 2020-08-10 DIAGNOSIS — Z1211 Encounter for screening for malignant neoplasm of colon: Secondary | ICD-10-CM

## 2020-08-10 MED ORDER — PEG 3350-KCL-NA BICARB-NACL 420 G PO SOLR: 4000.0000 mL | Freq: Once | ORAL | 0 refills | Status: AC

## 2020-08-10 NOTE — Patient Instructions (Signed)
Marie Cline   [birth year] MRN: [id number] Procedure Date: 09/15/2020 Arrival Time:   You will receive a call from the hospital a few days before your procedure.     Location of Procedure: [facility] Short Stay  PREPARATION FOR COLONOSCOPY WITH TRI-LYTE PREP  Please notify us immediately if you are diabetic, take iron supplements, or if you are on Coumadin or any other blood thinners.   Please hold the following medications: See letter   PROCEDURE IS SCHEDULED FOR Marie Cline AS FOLLOWS:  Procedure Date: 09/15/2020  Time to register: You will receive a call from the hospital a few days before your procedure. Place to register: [facility] Scheduled provider: Dr. Abbey Chatters   2 DAYS BEFORE PROCEDURE:  DATE: 09/13/2020   DAY: Wednesday Begin clear liquid diet AFTER your lunch meal. NO SOLID FOODS!   1 DAY BEFORE PROCEDURE:  DATE: 09/14/2020   DAY: Thursday  Continue clear liquids the entire day - NO SOLID FOOD.   Diabetic medications adjustments for today: See letter  At 12:00pm (noon): Take 2 (two) Dulcolax (Bisacodyl) tablets  At 2:00pm: Start drinking your solution. Try to drink 1 (one) 8 ounce glass every 10-15 minutes, until you have consumed HALF the jug. (You should complete the first 1/2 of the jug in 2 hours. Wait 30 minutes, then drink 3-4 more glasses of the solution. Your stools should be clear; if not, you may have to consume the rest of the jug.   One hour after completing the solution: take the last 2 (two) Dulcolax (Bisacodyl) tablets, with a clear liquid.  YOU MUST DRINK PLENTY OF CLEAR LIQUIDS DURING YOUR PREP TO REDUCE RISKS OF KIDNEY FAILURE.   Continue clear liquids only, until midnight. Do not eat or drink anything after midnight.  EXCEPTION:  If you take medications for your heart, blood pressure or breathing, you may take these medications with a small amount of clear liquid.      DAY OF PROCEDURE:   DATE: 09/15/2020       DAY: Friday The morning  of your procedure give yourself 1 (one) Fleet Enema, at least 1 hour before going to the hospital.   You may take Tylenol products. Please continue your regular medications unless we have instructed otherwise.   Diabetic medications adjustments for today. See letter  Someone MUST be available to drive you home; the hospital will cancel this appointment if you do not have a driver.   Please call the office if you have any questions (Dept: [phone]).  Please see below for Dietary Information.  CLEAR LIQUIDS INCLUDE:  Water Jello (NOT red in color)   Ice Popsicles (NOT red in color)   Tea (sugar ok, no milk/cream) Powdered fruit flavored drinks  Coffee (sugar ok, no milk/cream) Gatorade/ Lemonade/ Kool-Aid  (NOT red in color)   Juice: apple, white grape, white cranberry Soft drinks  Clear bullion, consomme, broth (fat free beef/chicken/vegetable)  Carbonated beverages (any kind)  Strained chicken noodle soup Hard Candy   REMEMBER: Clear liquids are liquids that will allow you to see your fingers on the other side of a clear glass. Be sure liquids are NOT red in color, and not cloudy, but CLEAR.   DO NOT EAT OR DRINK ANY OF THE FOLLOWING:  Dairy products of any kind   Cranberry juice Tomato juice / V8 juice   Grapefruit juice Orange juice     Red grape juice  Do not eat any solid foods, including such foods as:  cereal, oatmeal, yogurt, fruits, vegetables, creamed soups, eggs, bread, etc.    HELPFUL HINTS FOR DRINKING PREP SOLUTION:   Make sure prep is extremely cold. Refrigerate the night before. You may also put in the freezer.   You may try mixing some Crystal Light or Country Time Lemonade if you prefer. Mix in small amounts; add more if necessary.  Try drinking through a straw  Rinse mouth with water or a mouthwash between glasses, to remove after-taste.  Try sipping on a cold beverage /ice/ popsicles between glasses of prep  Place a piece of sugar-free hard candy  in mouth between glasses  If you become nauseated, try consuming smaller amounts, or stretch out the time between glasses. Stop for 30-60 minutes, then slowly start back drinking    You may call the office (Dept: [phone]) before 5:00pm, or page the doctor on call after 5:00pm ([phone]), for further instructions, if necessary.   OTHER INSTRUCTIONS  You will need a responsible adult at least 59 years of age to accompany you and drive you home. This person must remain in the waiting room during your procedure.  Wear loose fitting clothing that is easily removed.  Leave jewelry and other valuables at home.   Remove all body piercing jewelry and leave at home.  Total time from sign-in until discharge is approximately 2-3 hours.  You should go home directly after your procedure and rest. You can resume normal activities the day after your procedure.  The day of your procedure you should not:  Drive  Make legal decisions  Operate machinery  Drink alcohol  Return to work

## 2020-08-10 NOTE — Progress Notes (Signed)
Pt remembered she is taking ASA 81 mg.  Added to med list.

## 2020-08-10 NOTE — Progress Notes (Signed)
Gastroenterology Pre-Procedure Review  Request Date: 08/10/2020 Requesting Physician: Dr. Manuella Ghazi, Last TCS done 2017 in [place], pt could not remember who performed it, no polyps per pt  PATIENT REVIEW QUESTIONS: The patient responded to the following health history questions as indicated:    1. Diabetes Melitis: yes, type II 2. Joint replacements in the past 12 months: no 3. Major health problems in the past 3 months: no 4. Has an artificial valve or MVP: no 5. Has a defibrillator: no 6. Has been advised in past to take antibiotics in advance of a procedure like teeth cleaning: no 7. Family history of colon cancer: no  8. Alcohol Use: no 9. Illicit drug Use: no 10. History of sleep apnea: yes, CPAP  11. History of coronary artery or other vascular stents placed within the last 12 months: no 12. History of any prior anesthesia complications: yes, n/v 13. Body mass index is 35.15 kg/m.    MEDICATIONS & ALLERGIES:    Patient reports the following regarding taking any blood thinners:   Plavix? no Aspirin? yes Coumadin? no Brilinta? no Xarelto? no Eliquis? no Pradaxa? no Savaysa? no Effient? no  Patient confirms/reports the following medications:  Current Outpatient Medications  Medication Sig Dispense Refill  . doxycycline (VIBRAMYCIN) 100 MG capsule Take 100 mg by mouth daily.    Marland Kitchen esomeprazole (NEXIUM) 40 MG capsule Take 1 capsule (40 mg total) by mouth daily at 12 noon. 30 capsule 0  . lisinopril (PRINIVIL) 10 MG tablet Take 1 tablet (10 mg total) by mouth daily. 30 tablet 0  . meclizine (ANTIVERT) 25 MG tablet Take 1 tablet (25 mg total) by mouth 3 (three) times daily as needed for dizziness. (Patient taking differently: Take 25 mg by mouth as needed for dizziness.) 20 tablet 0  . metFORMIN (GLUCOPHAGE) 500 MG tablet Take 1 tablet (500 mg total) by mouth 2 (two) times daily with a meal. 60 tablet 0  . albuterol (VENTOLIN HFA) 108 (90 Base) MCG/ACT inhaler Inhale into the lungs  as needed.     No current facility-administered medications for this visit.    Patient confirms/reports the following allergies:  Allergies  Allergen Reactions  . Iodine Solution [Povidone Iodine] Itching and Swelling    No orders of the defined types were placed in this encounter.   AUTHORIZATION INFORMATION Primary Insurance: [employer],  [id number] #: [id number] Pre-Cert / Josem Kaufmann required: No, not required  SCHEDULE INFORMATION: Procedure has been scheduled as follows:  Date: 09/15/2020, Time: AM procedure Location: [facility] with Dr. Abbey Chatters  This Gastroenterology Pre-Precedure Review Form is being routed to the following provider(s): Roseanne Kaufman, NP

## 2020-08-17 ENCOUNTER — Ambulatory Visit

## 2020-08-21 NOTE — Progress Notes (Signed)
If last procedure in 2017, she may not need now? Is she still wanting to pursue regardless?

## 2020-08-21 NOTE — Progress Notes (Signed)
Spoke with pt and she said that if she doesn't need another colonoscopy then she would just wait until recommended repeat.  I asked her again if she had a history of polyps.  She denied having any former polyps.  Informed her that a 10 year repeat is usually recommended then and it would be 2027.

## 2020-08-24 NOTE — Progress Notes (Signed)
Called Dr. Trena Platt office and spoke to [place].  She informed me that there wasn't any particular reason/need for another colonoscopy.  She was informed that pt had last TCS done 2017 in [place].  She was made aware that recommended repeat would be 10 yrs.  She voiced understanding and is informing Dr. Manuella Ghazi.

## 2020-08-24 NOTE — Progress Notes (Signed)
Unless PCP wanted for a certain reason, we can hold off.

## 2020-09-13 ENCOUNTER — Other Ambulatory Visit (HOSPITAL_COMMUNITY): Admission: RE | Admit: 2020-09-13 | Source: Ambulatory Visit

## 2022-09-06 ENCOUNTER — Other Ambulatory Visit: Payer: Self-pay | Admitting: Internal Medicine

## 2022-09-06 DIAGNOSIS — Z1231 Encounter for screening mammogram for malignant neoplasm of breast: Secondary | ICD-10-CM

## 2022-09-17 ENCOUNTER — Ambulatory Visit
Admission: RE | Admit: 2022-09-17 | Discharge: 2022-09-17 | Disposition: A | Discharge status: Home / Self Care | Source: Ambulatory Visit | Attending: Internal Medicine | Admitting: Internal Medicine

## 2022-09-17 DIAGNOSIS — Z1231 Encounter for screening mammogram for malignant neoplasm of breast: Secondary | ICD-10-CM

## 2022-09-17 HISTORY — DX: Malignant neoplasm of unspecified site of unspecified female breast: C50.919

## 2023-09-17 ENCOUNTER — Other Ambulatory Visit: Payer: Self-pay | Admitting: Internal Medicine

## 2023-09-17 DIAGNOSIS — Z1231 Encounter for screening mammogram for malignant neoplasm of breast: Secondary | ICD-10-CM

## 2023-09-23 ENCOUNTER — Inpatient Hospital Stay: Admission: RE | Admit: 2023-09-23 | Source: Ambulatory Visit

## 2024-04-07 ENCOUNTER — Inpatient Hospital Stay: Admission: RE | Admit: 2024-04-07 | Payer: Self-pay | Source: Ambulatory Visit
